# Patient Record
Sex: Female | Born: 1965 | Race: Black or African American | Hispanic: No | Marital: Married | Smoking: Never smoker
Health system: Southern US, Community
[De-identification: ages and names within clinical notes are randomized; demographics above are authoritative.]

## PROBLEM LIST (undated history)

## (undated) DIAGNOSIS — M25562 Pain in left knee: Secondary | ICD-10-CM

## (undated) DIAGNOSIS — K219 Gastro-esophageal reflux disease without esophagitis: Secondary | ICD-10-CM

## (undated) DIAGNOSIS — R0789 Other chest pain: Principal | ICD-10-CM

## (undated) DIAGNOSIS — G8929 Other chronic pain: Secondary | ICD-10-CM

## (undated) DIAGNOSIS — E669 Obesity, unspecified: Secondary | ICD-10-CM

## (undated) DIAGNOSIS — K08409 Partial loss of teeth, unspecified cause, unspecified class: Secondary | ICD-10-CM

## (undated) DIAGNOSIS — R011 Cardiac murmur, unspecified: Secondary | ICD-10-CM

## (undated) DIAGNOSIS — M25561 Pain in right knee: Secondary | ICD-10-CM

## (undated) DIAGNOSIS — M7551 Bursitis of right shoulder: Secondary | ICD-10-CM

## (undated) DIAGNOSIS — T7840XA Allergy, unspecified, initial encounter: Secondary | ICD-10-CM

## (undated) DIAGNOSIS — Z01419 Encounter for gynecological examination (general) (routine) without abnormal findings: Secondary | ICD-10-CM

## (undated) HISTORY — DX: Encounter for gynecological examination (general) (routine) without abnormal findings: Z01.419

## (undated) HISTORY — DX: Pain in left knee: M25.562

## (undated) HISTORY — PX: TUBAL LIGATION: SHX77

## (undated) HISTORY — DX: Obesity, unspecified: E66.9

## (undated) HISTORY — DX: Pain in right knee: M25.561

## (undated) HISTORY — DX: Partial loss of teeth, unspecified cause, unspecified class: K08.409

## (undated) HISTORY — DX: Other chronic pain: G89.29

## (undated) HISTORY — DX: Allergy, unspecified, initial encounter: T78.40XA

## (undated) HISTORY — DX: Other chest pain: R07.89

## (undated) HISTORY — DX: Bursitis of right shoulder: M75.51

---

## 1987-04-10 HISTORY — PX: KNEE SURGERY: SHX244

## 1998-05-26 ENCOUNTER — Encounter: Payer: Self-pay | Admitting: *Deleted

## 1998-05-26 ENCOUNTER — Inpatient Hospital Stay (HOSPITAL_COMMUNITY): Admission: AD | Admit: 1998-05-26 | Discharge: 1998-05-26 | Payer: Self-pay | Admitting: *Deleted

## 2004-05-03 ENCOUNTER — Ambulatory Visit: Payer: Self-pay | Admitting: Internal Medicine

## 2004-05-08 ENCOUNTER — Other Ambulatory Visit: Admission: RE | Admit: 2004-05-08 | Discharge: 2004-05-08 | Payer: Self-pay | Admitting: Obstetrics & Gynecology

## 2005-04-26 ENCOUNTER — Emergency Department (HOSPITAL_COMMUNITY): Admission: EM | Admit: 2005-04-26 | Discharge: 2005-04-26 | Payer: Self-pay | Admitting: Emergency Medicine

## 2005-08-12 ENCOUNTER — Inpatient Hospital Stay (HOSPITAL_COMMUNITY): Admission: AD | Admit: 2005-08-12 | Discharge: 2005-08-12 | Payer: Self-pay | Admitting: Obstetrics and Gynecology

## 2008-10-16 ENCOUNTER — Emergency Department (HOSPITAL_COMMUNITY): Admission: EM | Admit: 2008-10-16 | Discharge: 2008-10-16 | Payer: Self-pay | Admitting: Emergency Medicine

## 2008-10-16 ENCOUNTER — Emergency Department (HOSPITAL_COMMUNITY): Admission: EM | Admit: 2008-10-16 | Discharge: 2008-10-16 | Payer: Self-pay | Admitting: Family Medicine

## 2010-07-16 LAB — URINE CULTURE

## 2010-07-16 LAB — DIFFERENTIAL
Eosinophils Absolute: 0 10*3/uL (ref 0.0–0.7)
Eosinophils Relative: 0 % (ref 0–5)
Lymphs Abs: 0.6 10*3/uL — ABNORMAL LOW (ref 0.7–4.0)
Monocytes Absolute: 0.3 10*3/uL (ref 0.1–1.0)
Monocytes Relative: 3 % (ref 3–12)

## 2010-07-16 LAB — POCT URINALYSIS DIP (DEVICE)
Bilirubin Urine: NEGATIVE
Glucose, UA: NEGATIVE mg/dL
Ketones, ur: NEGATIVE mg/dL
Nitrite: NEGATIVE
Protein, ur: 30 mg/dL — AB
Specific Gravity, Urine: >=1.03 (ref 1.005–1.030)
Urobilinogen, UA: 0.2 mg/dL (ref 0.0–1.0)
pH: 5.5 (ref 5.0–8.0)

## 2010-07-16 LAB — CBC
HCT: 40.7 % (ref 36.0–46.0)
Hemoglobin: 13.5 g/dL (ref 12.0–15.0)
MCV: 89.9 fL (ref 78.0–100.0)
RBC: 4.52 MIL/uL (ref 3.87–5.11)
WBC: 10 10*3/uL (ref 4.0–10.5)

## 2010-12-29 ENCOUNTER — Inpatient Hospital Stay (INDEPENDENT_AMBULATORY_CARE_PROVIDER_SITE_OTHER)
Admission: RE | Admit: 2010-12-29 | Discharge: 2010-12-29 | Disposition: A | Discharge status: Home / Self Care | Payer: Self-pay | Source: Ambulatory Visit | Attending: Family Medicine | Admitting: Family Medicine

## 2010-12-29 DIAGNOSIS — M67919 Unspecified disorder of synovium and tendon, unspecified shoulder: Secondary | ICD-10-CM

## 2011-05-17 ENCOUNTER — Ambulatory Visit
Admission: RE | Admit: 2011-05-17 | Discharge: 2011-05-17 | Disposition: A | Discharge status: Home / Self Care | Payer: Managed Care, Other (non HMO) | Source: Ambulatory Visit | Attending: Orthopedic Surgery | Admitting: Orthopedic Surgery

## 2011-05-17 ENCOUNTER — Other Ambulatory Visit: Payer: Self-pay | Admitting: Orthopedic Surgery

## 2011-05-17 DIAGNOSIS — M542 Cervicalgia: Secondary | ICD-10-CM

## 2012-10-06 ENCOUNTER — Other Ambulatory Visit: Payer: Self-pay | Admitting: Obstetrics and Gynecology

## 2012-10-06 DIAGNOSIS — R928 Other abnormal and inconclusive findings on diagnostic imaging of breast: Secondary | ICD-10-CM

## 2012-10-16 ENCOUNTER — Ambulatory Visit
Admission: RE | Admit: 2012-10-16 | Discharge: 2012-10-16 | Disposition: A | Discharge status: Home / Self Care | Payer: Managed Care, Other (non HMO) | Source: Ambulatory Visit | Attending: Obstetrics and Gynecology | Admitting: Obstetrics and Gynecology

## 2012-10-16 DIAGNOSIS — R928 Other abnormal and inconclusive findings on diagnostic imaging of breast: Secondary | ICD-10-CM

## 2013-01-26 ENCOUNTER — Encounter: Payer: Self-pay | Admitting: Medical

## 2013-02-23 ENCOUNTER — Encounter: Payer: Self-pay | Admitting: Medical

## 2013-04-26 ENCOUNTER — Emergency Department (INDEPENDENT_AMBULATORY_CARE_PROVIDER_SITE_OTHER): Payer: Managed Care, Other (non HMO)

## 2013-04-26 ENCOUNTER — Emergency Department (HOSPITAL_COMMUNITY)
Admission: EM | Admit: 2013-04-26 | Discharge: 2013-04-26 | Disposition: A | Discharge status: Home / Self Care | Payer: Managed Care, Other (non HMO) | Source: Home / Self Care | Attending: Emergency Medicine | Admitting: Emergency Medicine

## 2013-04-26 ENCOUNTER — Encounter (HOSPITAL_COMMUNITY): Payer: Self-pay | Admitting: Emergency Medicine

## 2013-04-26 DIAGNOSIS — B349 Viral infection, unspecified: Secondary | ICD-10-CM

## 2013-04-26 DIAGNOSIS — B9789 Other viral agents as the cause of diseases classified elsewhere: Secondary | ICD-10-CM

## 2013-04-26 LAB — CBC WITH DIFFERENTIAL/PLATELET
BASOS ABS: 0 10*3/uL (ref 0.0–0.1)
BASOS PCT: 0 % (ref 0–1)
EOS ABS: 0.2 10*3/uL (ref 0.0–0.7)
Eosinophils Relative: 3 % (ref 0–5)
HCT: 40.2 % (ref 36.0–46.0)
HEMOGLOBIN: 13.4 g/dL (ref 12.0–15.0)
Lymphocytes Relative: 39 % (ref 12–46)
Lymphs Abs: 2.3 10*3/uL (ref 0.7–4.0)
MCH: 29.9 pg (ref 26.0–34.0)
MCHC: 33.3 g/dL (ref 30.0–36.0)
MCV: 89.7 fL (ref 78.0–100.0)
MONOS PCT: 6 % (ref 3–12)
Monocytes Absolute: 0.4 10*3/uL (ref 0.1–1.0)
NEUTROS PCT: 52 % (ref 43–77)
Neutro Abs: 3 10*3/uL (ref 1.7–7.7)
PLATELETS: 310 10*3/uL (ref 150–400)
RBC: 4.48 MIL/uL (ref 3.87–5.11)
RDW: 13.4 % (ref 11.5–15.5)
WBC: 5.8 10*3/uL (ref 4.0–10.5)

## 2013-04-26 LAB — POCT RAPID STREP A: STREPTOCOCCUS, GROUP A SCREEN (DIRECT): NEGATIVE

## 2013-04-26 LAB — POCT INFECTIOUS MONO SCREEN: Mono Screen: NEGATIVE

## 2013-04-26 MED ORDER — IBUPROFEN 800 MG PO TABS
ORAL_TABLET | ORAL | Status: AC
  Filled 2013-04-26: qty 1

## 2013-04-26 MED ORDER — PREDNISONE 20 MG PO TABS: ORAL_TABLET | ORAL | Status: DC

## 2013-04-26 MED ORDER — HYDROCODONE-ACETAMINOPHEN 5-325 MG PO TABS: ORAL_TABLET | ORAL | Status: DC

## 2013-04-26 MED ORDER — IBUPROFEN 800 MG PO TABS
800.0000 mg | ORAL_TABLET | Freq: Once | ORAL | Status: AC
  Administered 2013-04-26: 800 mg via ORAL

## 2013-04-26 NOTE — Discharge Instructions (Signed)
Pharyngitis °Pharyngitis is redness, pain, and swelling (inflammation) of your pharynx.  °CAUSES  °Pharyngitis is usually caused by infection. Most of the time, these infections are from viruses (viral) and are part of a cold. However, sometimes pharyngitis is caused by bacteria (bacterial). Pharyngitis can also be caused by allergies. Viral pharyngitis may be spread from person to person by coughing, sneezing, and personal items or utensils (cups, forks, spoons, toothbrushes). Bacterial pharyngitis may be spread from person to person by more intimate contact, such as kissing.  °SIGNS AND SYMPTOMS  °Symptoms of pharyngitis include:   °· Sore throat.   °· Tiredness (fatigue).   °· Low-grade fever.   °· Headache. °· Joint pain and muscle aches. °· Skin rashes. °· Swollen lymph nodes. °· Plaque-like film on throat or tonsils (often seen with bacterial pharyngitis). °DIAGNOSIS  °Your health care provider will ask you questions about your illness and your symptoms. Your medical history, along with a physical exam, is often all that is needed to diagnose pharyngitis. Sometimes, a rapid strep test is done. Other lab tests may also be done, depending on the suspected cause.  °TREATMENT  °Viral pharyngitis will usually get better in 3 4 days without the use of medicine. Bacterial pharyngitis is treated with medicines that kill germs (antibiotics).  °HOME CARE INSTRUCTIONS  °· Drink enough water and fluids to keep your urine clear or pale yellow.   °· Only take over-the-counter or prescription medicines as directed by your health care provider:   °· If you are prescribed antibiotics, make sure you finish them even if you start to feel better.   °· Do not take aspirin.   °· Get lots of rest.   °· Gargle with 8 oz of salt water (½ tsp of salt per 1 qt of water) as often as every 1 2 hours to soothe your throat.   °· Throat lozenges (if you are not at risk for choking) or sprays may be used to soothe your throat. °SEEK MEDICAL  CARE IF:  °· You have large, tender lumps in your neck. °· You have a rash. °· You cough up green, yellow-brown, or bloody spit. °SEEK IMMEDIATE MEDICAL CARE IF:  °· Your neck becomes stiff. °· You drool or are unable to swallow liquids. °· You vomit or are unable to keep medicines or liquids down. °· You have severe pain that does not go away with the use of recommended medicines. °· You have trouble breathing (not caused by a stuffy nose). °MAKE SURE YOU:  °· Understand these instructions. °· Will watch your condition. °· Will get help right away if you are not doing well or get worse. °Document Released: 03/26/2005 Document Revised: 01/14/2013 Document Reviewed: 12/01/2012 °ExitCare® Patient Information ©2014 [employer]. ° °

## 2013-04-26 NOTE — ED Provider Notes (Signed)
Chief Complaint:   Chief Complaint  Patient presents with  . Torticollis    History of Present Illness:   [name] is a 48 year old female who's had a 9 day history of right neck pain. She woke up with this pain. She denies any injury to the neck. The pain is localized to the right side of the neck and radiates up into the head and down towards the shoulder but not in the arm. There is no numbness, tingling, weakness in the arm. No swelling of the arm. It hurts to move the neck in any direction. The patient also has had sore throat, fever of up to 101, cough, and headache. She denies any nasal congestion, rhinorrhea, swollen glands in her neck, chest pain, or shortness of breath. She went to [facility] when this pain first began. That was a week ago. She had strep test there which was negative. She was told to take Aleve. She returned this past Friday which was 3 days ago to the same [facility]. They detected an ear infection and gave her amoxicillin. She still feels no better.  Review of Systems:  Other than noted above, the patient denies any of the following symptoms: Constitutional:  No fever, chills, or sweats. ENT:  No nasal congestion, sore throat, or oral ulcerations or lesions. Neck:  No swelling, or adenopathy.  Full ROM without pain. Cardiac:  No chest pain, tightness, or pressure. Respiratory:  No cough, wheezing, or dyspnea. M-S:  No joint pain, muscle pain, or back problems. Neuro:  No muscle weakness, numbness or paresthesias.  PMFSH:  Past medical history, family history, social history, meds, and allergies were reviewed.   Physical Exam:   Vital signs:  BP 114/69  Pulse 68  Temp(Src) 98.2 F (36.8 C) (Oral)  Resp 18  SpO2 99% General:  Alert, oriented and in no distress. Eye:  PERRL, full EOMs. ENT:  Pharynx was erythematous and swollen with no exudate, no oral lesions. Both TMs and canals were normal. Neck:  There is pain to palpation on  the right side of the neck, no adenopathy or mass. The neck has limited range of motion with pain in all directions. Lungs:  No respiratory distress.  Breath sounds clear and equal bilaterally.  No wheezes, rales or rhonchi. Heart:  Regular rhythm.  No gallops, murmers, or rubs. Ext:  No upper extremity edema, pulses full.  Full ROM of joints with no joint or muscle pain to palpation. Neuro:  Alert and oriented times 3.  No focal muscle weakness.  DTRs symmetric.  Sensation intact to light touch. Skin: Clear, warm and dry.  No rash.  Good capillary refill.  Radiology:  Dg Cervical Spine Complete  04/26/2013   CLINICAL DATA:  Right-sided neck pain for 9 days.  No known injury.  EXAM: CERVICAL SPINE  4+ VIEWS  COMPARISON:  MR C SPINE W/O CM dated 05/17/2011; DG CERVICAL SPINE COMPLETE dated 04/26/2005  FINDINGS: The prevertebral soft tissues are normal. The alignment is anatomic through T1. There is no evidence of acute fracture or traumatic subluxation. The C1-2 articulation appears normal in the AP projection. The disc spaces are preserved. There is no osseous foraminal stenosis.  IMPRESSION: Stable unremarkable cervical spine radiographs.   Electronically Signed   By: Roxy HorsemanBill  Veazey M.D.   On: 04/26/2013 14:12   Other Labs Obtained at Urgent Care Center:    Results for orders placed during the hospital encounter of 04/26/13  CBC  WITH DIFFERENTIAL      Result Value Range   WBC 5.8  4.0 - 10.5 K/uL   RBC 4.48  3.87 - 5.11 MIL/uL   Hemoglobin 13.4  12.0 - 15.0 g/dL   HCT 16.1  09.6 - 04.5 %   MCV 89.7  78.0 - 100.0 fL   MCH 29.9  26.0 - 34.0 pg   MCHC 33.3  30.0 - 36.0 g/dL   RDW 40.9  81.1 - 91.4 %   Platelets 310  150 - 400 K/uL   Neutrophils Relative % 52  43 - 77 %   Neutro Abs 3.0  1.7 - 7.7 K/uL   Lymphocytes Relative 39  12 - 46 %   Lymphs Abs 2.3  0.7 - 4.0 K/uL   Monocytes Relative 6  3 - 12 %   Monocytes Absolute 0.4  0.1 - 1.0 K/uL   Eosinophils Relative 3  0 - 5 %   Eosinophils  Absolute 0.2  0.0 - 0.7 K/uL   Basophils Relative 0  0 - 1 %   Basophils Absolute 0.0  0.0 - 0.1 K/uL  POCT RAPID STREP A ([facility] CARE ONLY)      Result Value Range   Streptococcus, Group A Screen (Direct) NEGATIVE  NEGATIVE  POCT INFECTIOUS MONO SCREEN      Result Value Range   Mono Screen NEGATIVE  NEGATIVE   Course in Urgent Care Center:   Given Motrin 800 mg by mouth.  Assessment:  The encounter diagnosis was Viral syndrome.  No evidence of meningitis, strep, or mono. Her x-ray was negative. This is most consistent with a viral illness.  Plan:   1.  Meds:  The following meds were prescribed:   Discharge Medication List as of 04/26/2013  3:11 PM    START taking these medications   Details  HYDROcodone-acetaminophen (NORCO/VICODIN) 5-325 MG per tablet 1 to 2 tabs every 4 to 6 hours as needed for pain., Print    predniSONE (DELTASONE) 20 MG tablet 3 daily for 5 days, 2 daily for 5 days, 1 daily for 5 days, Normal        2.  Patient Education/Counseling:  The patient was given appropriate handouts, self care instructions, and instructed in symptomatic relief.   3.  Follow up:  The patient was told to follow up if no better in 3 to 4 days, if becoming worse in any way, and given some red flag symptoms such as worsening fever or worsening pain which would prompt immediate return.  Follow up here as necessary.     Reuben Likes, MD 04/26/13 2121

## 2013-04-26 NOTE — ED Notes (Signed)
Pt given graham cracker along with iburpofen since she has not eaten anything today

## 2013-04-26 NOTE — ED Notes (Signed)
Pt c/o right sided neck pain and stiffness x 1 week. Throat is also sore. [facility] and was told she had a right ear infection and started on amoxicillin bid. Pt reports she feels a little better after starting but has no ROM in her neck still. Pt is alert and oriented and in no acute distress.

## 2013-04-26 NOTE — ED Notes (Signed)
Rx was not attached for hydrocodene/acetaminophen Rx called in [facility].

## 2013-04-28 LAB — CULTURE, GROUP A STREP

## 2013-08-06 ENCOUNTER — Ambulatory Visit (INDEPENDENT_AMBULATORY_CARE_PROVIDER_SITE_OTHER): Payer: Managed Care, Other (non HMO) | Admitting: Medical

## 2013-08-06 ENCOUNTER — Encounter: Payer: Self-pay | Admitting: Medical

## 2013-08-06 VITALS — BP 110/78 | HR 76 | Temp 98.0°F | Resp 16 | Ht 62.0 in | Wt 136.0 lb

## 2013-08-06 DIAGNOSIS — L989 Disorder of the skin and subcutaneous tissue, unspecified: Secondary | ICD-10-CM

## 2013-08-06 DIAGNOSIS — Z0000 Encounter for general adult medical examination without abnormal findings: Secondary | ICD-10-CM

## 2013-08-06 DIAGNOSIS — R490 Dysphonia: Secondary | ICD-10-CM

## 2013-08-06 DIAGNOSIS — Z0100 Encounter for examination of eyes and vision without abnormal findings: Secondary | ICD-10-CM

## 2013-08-06 LAB — COMPREHENSIVE METABOLIC PANEL
ALK PHOS: 52 U/L (ref 39–117)
ALT: 16 U/L (ref 0–35)
AST: 15 U/L (ref 0–37)
Albumin: 4.2 g/dL (ref 3.5–5.2)
BUN: 17 mg/dL (ref 6–23)
CO2: 28 mEq/L (ref 19–32)
CREATININE: 0.79 mg/dL (ref 0.50–1.10)
Calcium: 9.1 mg/dL (ref 8.4–10.5)
Chloride: 107 mEq/L (ref 96–112)
Glucose, Bld: 83 mg/dL (ref 70–99)
POTASSIUM: 3.8 meq/L (ref 3.5–5.3)
Sodium: 141 mEq/L (ref 135–145)
Total Bilirubin: 0.3 mg/dL (ref 0.2–1.2)
Total Protein: 7.3 g/dL (ref 6.0–8.3)

## 2013-08-06 LAB — POCT URINALYSIS DIPSTICK
BILIRUBIN UA: NEGATIVE
Glucose, UA: NEGATIVE
KETONES UA: NEGATIVE
LEUKOCYTES UA: NEGATIVE
Nitrite, UA: NEGATIVE
Spec Grav, UA: 1.025
Urobilinogen, UA: NEGATIVE
pH, UA: 5

## 2013-08-06 LAB — LIPID PANEL
CHOL/HDL RATIO: 2.4 ratio
CHOLESTEROL: 195 mg/dL (ref 0–200)
HDL: 82 mg/dL (ref >39)
LDL CALC: 98 mg/dL (ref 0–99)
TRIGLYCERIDES: 74 mg/dL (ref <150)
VLDL: 15 mg/dL (ref 0–40)

## 2013-08-06 LAB — TSH: TSH: 1.613 u[IU]/mL (ref 0.350–4.500)

## 2013-08-06 MED ORDER — ESOMEPRAZOLE MAGNESIUM 40 MG PO CPDR
40.0000 mg | DELAYED_RELEASE_CAPSULE | Freq: Every day | ORAL | Status: DC
Start: 2013-08-06 — End: 2013-10-06

## 2013-08-06 NOTE — Progress Notes (Signed)
Subjective:   HPI  [name] is a 48 y.o. female who presents for a complete physical.  Medical care team includes:  Dr. Henderson CloudHorvath for gynecology  Dr. August Saucerean, orthopedics  Kristian CoveyShane Tysinger, PA-C establishing here today for care   Preventative care: Last ophthalmology visit:3 yrs ago no doctor Last dental visit:2 yrs no dentist Last colonoscopy:no Last mammogram:2014 Last gynecological exam2014 [place]: Last EKG:yrs ago Last labs:2014  Prior vaccinations: TD or Tdap:2013 Influenza:2014 Pneumococcal:no Shingles/Zostavax:no  Advanced directive:no Health care power of attorney:no Living will:no  Concerns: Hoarseness in voice, intermittent, x  1year.  Has attributed to mucous, allergies.    Screams at games, son plays basketball.  Not sure about acid reflux, gets epigastric pain occasionally.   Skin lesions changing under breasts that aggravate her.  Wants them removed.  They have been getting bigger.  Reviewed their medical, surgical, family, social, medication, and allergy history and updated chart as appropriate.  Past Medical History  Diagnosis Date  . Allergy   . Bursitis of shoulder, right   . Knee pain, bilateral     intermittent pain, ran track in high school  . Wisdom teeth extracted   . Routine gynecological examination     Dr. Henderson CloudHorvath  . History of mammogram 2014    diagnostic, benign findings    Past Surgical History  Procedure Laterality Date  . Knee surgery  1989    tendonitis, right knee;   . Tubal ligation      History   Social History  . Marital Status: Married    Spouse Name: N/A    Number of Children: N/A  . Years of Education: N/A   Occupational History  . Not on file.   Social History Main Topics  . Smoking status: Never Smoker   . Smokeless tobacco: Not on file  . Alcohol Use: No  . Drug Use: No  . Sexual Activity: Not on file   Other Topics Concern  . Not on file   Social History Narrative   Lives at home  with husband, father, 2 children, 47yo and 17yo, 1 grandchild, [name], Manufacturing systems engineerpreschool teacher at [employer], exercise with walking and stretching    Family History  Problem Relation Age of Onset  . Hypertension Mother   . Kidney disease Mother     hypertensive kidney failure, renal transplant  . Diabetes Mother   . Hypertension Father   . Fibroids Sister   . Cancer Neg Hx   . Heart disease Neg Hx   . Stroke Neg Hx     Current outpatient prescriptions:Multiple Vitamins-Minerals (MULTIVITAMIN WITH MINERALS) tablet, Take 1 tablet by mouth daily., Disp: , Rfl: ;  HYDROcodone-acetaminophen (NORCO/VICODIN) 5-325 MG per tablet, 1 to 2 tabs every 4 to 6 hours as needed for pain., Disp: 20 tablet, Rfl: 0;  predniSONE (DELTASONE) 20 MG tablet, 3 daily for 5 days, 2 daily for 5 days, 1 daily for 5 days, Disp: 30 tablet, Rfl: 0  No Known Allergies     Review of Systems Constitutional: -fever, -chills, -sweats, -unexpected weight change, -decreased appetite, +fatigue Allergy: -sneezing, -itching, -congestion Dermatology: +changing moles, --rash, -lumps ENT: -runny nose, -ear pain, -sore throat, -+hoarseness, -sinus pain, -teeth pain, - ringing in ears, -hearing loss, -nosebleeds Cardiology: -chest pain, -palpitations, -swelling, -difficulty breathing when lying flat, -waking up short of breath Respiratory: -cough, -shortness of breath, -difficulty breathing with exercise or exertion, -wheezing, -coughing up blood Gastroenterology: -abdominal pain, -nausea, -vomiting, -diarrhea, -constipation, -blood in  stool, -changes in bowel movement, -difficulty swallowing or eating Hematology: -bleeding, -bruising  Musculoskeletal: +joint aches, +muscle aches, +joint swelling, -back pain, -neck pain, -cramping, -changes in gait Ophthalmology: denies vision changes, eye redness, itching, discharge Urology: -burning with urination, -difficulty urinating, -blood in urine, -urinary frequency, -urgency,  -incontinence Neurology: -headache, -weakness, -tingling, -numbness, -memory loss, -falls, -dizziness Psychology: -depressed mood, -agitation, -sleep problems     Objective:   Physical Exam  BP 110/78  Pulse 76  Temp(Src) 98 F (36.7 C) (Oral)  Resp 16  Ht 5\' 2"  (1.575 m)  Wt 136 lb (61.689 kg)  BMI 24.87 kg/m2  General appearance: alert, no distress, WD/WN, AA female Skin: several small brown lesions under both breasts, 1 under left breast with raised brown fleshy lesion, right upper back 4cm x 1cm flat scar, likely from prior scar/abrasion, no worrisome lesions HEENT: normocephalic, conjunctiva/corneas normal, sclerae anicteric, PERRLA, EOMi, nares patent, no discharge or erythema, pharynx normal Oral cavity: MMM, tongue normal, teeth - few areas of decay, otherwise in good repair Neck: supple, no lymphadenopathy, no thyromegaly, no masses, normal ROM, no bruits Chest: non tender, normal shape and expansion Heart: RRR, normal S1, S2, no murmurs Lungs: CTA bilaterally, no wheezes, rhonchi, or rales Abdomen: +bs, soft, non tender, non distended, no masses, no hepatomegaly, no splenomegaly, no bruits Back: non tender, normal ROM, no scoliosis Musculoskeletal: upper extremities non tender, no obvious deformity, normal ROM throughout, lower extremities non tender, no obvious deformity, normal ROM throughout Extremities: no edema, no cyanosis, no clubbing Pulses: 2+ symmetric, upper and lower extremities, normal cap refill Neurological: alert, oriented x 3, CN2-12 intact, strength normal upper extremities and lower extremities, sensation normal throughout, DTRs 2+ throughout, no cerebellar signs, gait normal Psychiatric: normal affect, behavior normal, pleasant  Breast/gyn/rectal - deferred to gyn   Assessment and Plan :    Encounter Diagnoses  Name Primary?  . Routine general medical examination at a health care facility Yes  . Hoarseness of voice   . Skin lesion of chest wall       Physical exam - discussed healthy lifestyle, diet, exercise, preventative care, vaccinations, and addressed their concerns.  Handout given. See eye doctor and dentist, see gynecology yearly Hoarseness - begin Nexium samples, avoid GERD triggers, recheck 1-2681mo Skin lesion - return for procedure, biopsy Follow-up pending labs, 1-2 mo on hoarseness.

## 2013-08-06 NOTE — Addendum Note (Signed)
Addended by: Lilli LightLOMAX, Charolett Yarrow G on: 08/06/2013 01:11 PM   Modules accepted: Orders

## 2013-08-06 NOTE — Patient Instructions (Addendum)
  Thank you for giving me the opportunity to serve you today.    Your diagnosis today includes: Encounter Diagnoses  Name Primary?  . Routine general medical examination at a health care facility Yes  . Hoarseness of voice      Specific recommendations today include:  See eye doctor and dentist yearly  Begin acid reflux medication once daily 30-45 minutes prior to breakfast  Avoid acid reflux trigger foods  If you feel that allergies or post nasal drainage are also affecting your hoaroanes, then begin Zyrtec at bedtime daily  Recheck in 1-2 months on hoarsness  Get flu shot yearly, preferably in September    Return pending labs.    I have included other useful information below for your review.  Diet for Gastroesophageal Reflux Disease, Adult Reflux (acid reflux) is when acid from your stomach flows up into the esophagus. When acid comes in contact with the esophagus, the acid causes irritation and soreness (inflammation) in the esophagus. When reflux happens often or so severely that it causes damage to the esophagus, it is called gastroesophageal reflux disease (GERD). Nutrition therapy can help ease the discomfort of GERD. FOODS OR DRINKS TO AVOID OR LIMIT  Smoking or chewing tobacco. Nicotine is one of the most potent stimulants to acid production in the gastrointestinal tract.  Caffeinated and decaffeinated coffee and black tea.  Regular or low-calorie carbonated beverages or energy drinks (caffeine-free carbonated beverages are allowed).   Strong spices, such as black pepper, white pepper, red pepper, cayenne, curry powder, and chili powder.  Peppermint or spearmint.  Chocolate.  High-fat foods, including meats and fried foods. Extra added fats including oils, butter, salad dressings, and nuts. Limit these to less than 8 tsp per day.  Fruits and vegetables if they are not tolerated, such as citrus fruits or tomatoes.  Alcohol.  Any food that seems to  aggravate your condition. If you have questions regarding your diet, call your caregiver or a registered dietitian. OTHER THINGS THAT MAY HELP GERD INCLUDE:   Eating your meals slowly, in a relaxed setting.  Eating 5 to 6 small meals per day instead of 3 large meals.  Eliminating food for a period of time if it causes distress.  Not lying down until 3 hours after eating a meal.  Keeping the head of your bed raised 6 to 9 inches (15 to 23 cm) by using a foam wedge or blocks under the legs of the bed. Lying flat may make symptoms worse.  Being physically active. Weight loss may be helpful in reducing reflux in overweight or obese adults.  Wear loose fitting clothing EXAMPLE MEAL PLAN This meal plan is approximately 2,000 calories based on https://www.[place].org/ChooseMyPlate.gov meal planning guidelines. Breakfast   cup cooked oatmeal.  1 cup strawberries.  1 cup low-fat milk.  1 oz almonds. Snack  1 cup cucumber slices.  6 oz yogurt (made from low-fat or fat-free milk). Lunch  2 slice whole-wheat bread.  2 oz sliced [place]urkey.  2 tsp mayonnaise.  1 cup blueberries.  1 cup snap peas. Snack  6 whole-wheat crackers.  1 oz string cheese. Dinner   cup brown rice.  1 cup mixed veggies.  1 tsp olive oil.  3 oz grilled fish. Document Released: 03/26/2005 Document Revised: 06/18/2011 Document Reviewed: 02/09/2011 [facility] Patient Information 2014 [employer], [place].

## 2013-08-07 ENCOUNTER — Encounter: Payer: Self-pay | Admitting: Family Medicine

## 2013-08-07 LAB — VITAMIN D 25 HYDROXY (VIT D DEFICIENCY, FRACTURES): Vit D, 25-Hydroxy: 35 ng/mL (ref 30–89)

## 2013-09-15 ENCOUNTER — Other Ambulatory Visit: Payer: Self-pay | Admitting: Orthopedic Surgery

## 2013-09-15 DIAGNOSIS — R52 Pain, unspecified: Secondary | ICD-10-CM

## 2013-09-19 ENCOUNTER — Ambulatory Visit
Admission: RE | Admit: 2013-09-19 | Discharge: 2013-09-19 | Disposition: A | Discharge status: Home / Self Care | Payer: Managed Care, Other (non HMO) | Source: Ambulatory Visit | Attending: Orthopedic Surgery | Admitting: Orthopedic Surgery

## 2013-09-19 DIAGNOSIS — R52 Pain, unspecified: Secondary | ICD-10-CM

## 2013-09-29 ENCOUNTER — Encounter (HOSPITAL_COMMUNITY): Payer: Self-pay | Admitting: Pharmacy Technician

## 2013-09-29 ENCOUNTER — Other Ambulatory Visit (HOSPITAL_COMMUNITY): Payer: Self-pay | Admitting: Orthopedic Surgery

## 2013-09-30 NOTE — Pre-Procedure Instructions (Signed)
Sherrian T Turner-Cuthrell  09/30/2013   Your procedure is scheduled on: Tuesday, October 06, 2013  [facility]45 [facility].  Call this number if you have problems the morning of surgery: [phone]   Remember:   Do not eat food or drink liquids after midnight Monday, October 05, 2013     Take these medicines the morning of surgery with A SIP OF WATER: esomeprazole (NEXIUM) Stop taking Aspirin, vitamins and herbal medications. Do not take any NSAIDs ie: Ibuprofen, Advil, Naproxen or any medication containing Aspirin; stop now.   Do not wear jewelry, make-up or nail polish.  Do not wear lotions, powders, or perfumes. You may wear deodorant.  Do not shave 48 hours prior to surgery.   Do not bring valuables to the hospital.  [facility] is not responsible for any belongings or valuables.               Contacts, dentures or bridgework may not be worn into surgery.  Leave suitcase in the car. After surgery it may be brought to your room.  For patients admitted to the hospital, discharge time is determined by your treatment team.               Patients discharged the day of surgery will not be allowed to drive home.  Name and phone number of your driver:   Special Instructions:  Special Instructions:Special Instructions: [facility]  Before surgery, you can play an important role.  Because skin is not sterile, your skin needs to be as free of germs as possible.  You can reduce the number of germs on you skin by washing with CHG (chlorahexidine gluconate) soap before surgery.  CHG is an antiseptic cleaner which kills germs and bonds with the skin to continue killing germs even after washing.  Please DO NOT use if you have an allergy to CHG or antibacterial soaps.  If your skin becomes reddened/irritated stop using the CHG and inform your nurse when you arrive at Short Stay.  Do not shave (including legs and underarms) for at least 48 hours  prior to the first CHG shower.  You may shave your face.  Please follow these instructions carefully:   1.  Shower with CHG Soap the night before surgery and the morning of Surgery.  2.  If you choose to wash your hair, wash your hair first as usual with your normal shampoo.  3.  After you shampoo, rinse your hair and body thoroughly to remove the Shampoo.  4.  Use CHG as you would any other liquid soap.  You can apply chg directly  to the skin and wash gently with scrungie or a clean washcloth.  5.  Apply the CHG Soap to your body ONLY FROM THE NECK DOWN.  Do not use on open wounds or open sores.  Avoid contact with your eyes, ears, mouth and genitals (private parts).  Wash genitals (private parts) with your normal soap.  6.  Wash thoroughly, paying special attention to the area where your surgery will be performed.  7.  Thoroughly rinse your body with warm water from the neck down.  8.  DO NOT shower/wash with your normal soap after using and rinsing off the CHG Soap.  9.  Pat yourself dry with a clean towel.            10.  Wear clean pajamas.  11.  Place clean sheets on your bed the night of your first shower and do not sleep with pets.  Day of Surgery  Do not apply any lotions the morning of surgery.  Please wear clean clothes to the hospital/surgery center.   Please read over the following fact sheets that you were given: Pain Booklet, Coughing and Deep Breathing and Surgical Site Infection Prevention

## 2013-10-01 ENCOUNTER — Encounter (HOSPITAL_COMMUNITY)
Admission: RE | Admit: 2013-10-01 | Discharge: 2013-10-01 | Disposition: A | Discharge status: Home / Self Care | Payer: Managed Care, Other (non HMO) | Source: Ambulatory Visit | Attending: Orthopedic Surgery | Admitting: Orthopedic Surgery

## 2013-10-01 ENCOUNTER — Encounter (HOSPITAL_COMMUNITY): Payer: Self-pay

## 2013-10-01 DIAGNOSIS — Z01818 Encounter for other preprocedural examination: Secondary | ICD-10-CM | POA: Insufficient documentation

## 2013-10-01 DIAGNOSIS — Z01812 Encounter for preprocedural laboratory examination: Secondary | ICD-10-CM | POA: Insufficient documentation

## 2013-10-01 HISTORY — DX: Gastro-esophageal reflux disease without esophagitis: K21.9

## 2013-10-01 HISTORY — DX: Cardiac murmur, unspecified: R01.1

## 2013-10-01 LAB — BASIC METABOLIC PANEL
BUN: 15 mg/dL (ref 6–23)
CALCIUM: 9.9 mg/dL (ref 8.4–10.5)
CO2: 28 mEq/L (ref 19–32)
CREATININE: 0.79 mg/dL (ref 0.50–1.10)
Chloride: 104 mEq/L (ref 96–112)
GFR calc Af Amer: >90 mL/min (ref >90)
GFR calc non Af Amer: >90 mL/min (ref >90)
GLUCOSE: 86 mg/dL (ref 70–99)
Potassium: 3.9 mEq/L (ref 3.7–5.3)
Sodium: 142 mEq/L (ref 137–147)

## 2013-10-01 LAB — CBC
HCT: 35.7 % — ABNORMAL LOW (ref 36.0–46.0)
Hemoglobin: 11.5 g/dL — ABNORMAL LOW (ref 12.0–15.0)
MCH: 28.6 pg (ref 26.0–34.0)
MCHC: 32.2 g/dL (ref 30.0–36.0)
MCV: 88.8 fL (ref 78.0–100.0)
PLATELETS: 279 10*3/uL (ref 150–400)
RBC: 4.02 MIL/uL (ref 3.87–5.11)
RDW: 13.3 % (ref 11.5–15.5)
WBC: 5.8 10*3/uL (ref 4.0–10.5)

## 2013-10-01 MED ORDER — CHLORHEXIDINE GLUCONATE 4 % EX LIQD: 60.0000 mL | Freq: Once | CUTANEOUS | Status: DC

## 2013-10-05 MED ORDER — CEFAZOLIN SODIUM-DEXTROSE 2-3 GM-% IV SOLR
2.0000 g | INTRAVENOUS | Status: AC
  Administered 2013-10-06: 2 g via INTRAVENOUS
  Filled 2013-10-05: qty 50

## 2013-10-06 ENCOUNTER — Encounter (HOSPITAL_COMMUNITY): Payer: Managed Care, Other (non HMO) | Admitting: Anesthesiology

## 2013-10-06 ENCOUNTER — Encounter (HOSPITAL_COMMUNITY)
Admission: RE | Disposition: A | Discharge status: Home / Self Care | Payer: Self-pay | Source: Ambulatory Visit | Attending: Orthopedic Surgery

## 2013-10-06 ENCOUNTER — Ambulatory Visit (HOSPITAL_COMMUNITY): Payer: Managed Care, Other (non HMO) | Admitting: Anesthesiology

## 2013-10-06 ENCOUNTER — Ambulatory Visit (HOSPITAL_COMMUNITY)
Admission: RE | Admit: 2013-10-06 | Discharge: 2013-10-07 | Disposition: A | Discharge status: Home / Self Care | Payer: Managed Care, Other (non HMO) | Source: Ambulatory Visit | Attending: Orthopedic Surgery | Admitting: Orthopedic Surgery

## 2013-10-06 ENCOUNTER — Encounter (HOSPITAL_COMMUNITY): Payer: Self-pay | Admitting: *Deleted

## 2013-10-06 DIAGNOSIS — M9420 Chondromalacia, unspecified site: Secondary | ICD-10-CM | POA: Insufficient documentation

## 2013-10-06 DIAGNOSIS — K219 Gastro-esophageal reflux disease without esophagitis: Secondary | ICD-10-CM | POA: Insufficient documentation

## 2013-10-06 DIAGNOSIS — M959 Acquired deformity of musculoskeletal system, unspecified: Secondary | ICD-10-CM | POA: Insufficient documentation

## 2013-10-06 DIAGNOSIS — Z5333 Arthroscopic surgical procedure converted to open procedure: Secondary | ICD-10-CM | POA: Insufficient documentation

## 2013-10-06 DIAGNOSIS — M238X2 Other internal derangements of left knee: Secondary | ICD-10-CM | POA: Diagnosis present

## 2013-10-06 DIAGNOSIS — M1710 Unilateral primary osteoarthritis, unspecified knee: Secondary | ICD-10-CM | POA: Insufficient documentation

## 2013-10-06 DIAGNOSIS — IMO0002 Reserved for concepts with insufficient information to code with codable children: Secondary | ICD-10-CM

## 2013-10-06 HISTORY — PX: KNEE ARTHROSCOPY WITH DRILLING/MICROFRACTURE: SHX6425

## 2013-10-06 SURGERY — ARTHROSCOPY, KNEE, WITH ABRASION ARTHROPLASTY OR MICROFRACTURE
Anesthesia: General | Site: Knee | Laterality: Left

## 2013-10-06 MED ORDER — ONDANSETRON HCL 4 MG/2ML IJ SOLN: 4.0000 mg | Freq: Four times a day (QID) | INTRAMUSCULAR | Status: DC | PRN

## 2013-10-06 MED ORDER — LIDOCAINE HCL (CARDIAC) 20 MG/ML IV SOLN
INTRAVENOUS | Status: AC
  Filled 2013-10-06: qty 10

## 2013-10-06 MED ORDER — CHLORHEXIDINE GLUCONATE 4 % EX LIQD
60.0000 mL | Freq: Once | CUTANEOUS | Status: DC
  Filled 2013-10-06: qty 60

## 2013-10-06 MED ORDER — OXYCODONE HCL 5 MG PO TABS
5.0000 mg | ORAL_TABLET | ORAL | Status: DC | PRN
  Administered 2013-10-06 – 2013-10-07 (×4): 10 mg via ORAL
  Filled 2013-10-06 (×4): qty 2

## 2013-10-06 MED ORDER — POTASSIUM CHLORIDE IN NACL 20-0.9 MEQ/L-% IV SOLN
INTRAVENOUS | Status: DC
  Administered 2013-10-06: 22:00:00 via INTRAVENOUS
  Filled 2013-10-06 (×3): qty 1000

## 2013-10-06 MED ORDER — METOCLOPRAMIDE HCL 10 MG PO TABS: 5.0000 mg | ORAL_TABLET | Freq: Three times a day (TID) | ORAL | Status: DC | PRN

## 2013-10-06 MED ORDER — BUPIVACAINE HCL (PF) 0.25 % IJ SOLN
INTRAMUSCULAR | Status: AC
  Filled 2013-10-06: qty 30

## 2013-10-06 MED ORDER — WARFARIN - PHARMACIST DOSING INPATIENT: Freq: Every day | Status: DC

## 2013-10-06 MED ORDER — HYDROMORPHONE HCL PF 1 MG/ML IJ SOLN
0.2500 mg | INTRAMUSCULAR | Status: DC | PRN
  Administered 2013-10-06 (×2): 0.5 mg via INTRAVENOUS

## 2013-10-06 MED ORDER — METOCLOPRAMIDE HCL 5 MG/ML IJ SOLN: 10.0000 mg | Freq: Once | INTRAMUSCULAR | Status: DC | PRN

## 2013-10-06 MED ORDER — LACTATED RINGERS IV SOLN
INTRAVENOUS | Status: DC | PRN
  Administered 2013-10-06 (×2): via INTRAVENOUS

## 2013-10-06 MED ORDER — OXYCODONE HCL 5 MG PO TABS: 5.0000 mg | ORAL_TABLET | Freq: Once | ORAL | Status: DC | PRN

## 2013-10-06 MED ORDER — MORPHINE SULFATE 4 MG/ML IJ SOLN
INTRAMUSCULAR | Status: AC
  Filled 2013-10-06: qty 1

## 2013-10-06 MED ORDER — DEXAMETHASONE SODIUM PHOSPHATE 4 MG/ML IJ SOLN
INTRAMUSCULAR | Status: DC | PRN
  Administered 2013-10-06: 8 mg via INTRAVENOUS

## 2013-10-06 MED ORDER — EVICEL 2 ML EX KIT
PACK | CUTANEOUS | Status: AC
Start: 2013-10-06 — End: 2013-10-06
  Filled 2013-10-06: qty 1

## 2013-10-06 MED ORDER — EVICEL 2 ML EX KIT
PACK | CUTANEOUS | Status: DC | PRN
  Administered 2013-10-06: 2 mL

## 2013-10-06 MED ORDER — ONDANSETRON HCL 4 MG PO TABS: 4.0000 mg | ORAL_TABLET | Freq: Four times a day (QID) | ORAL | Status: DC | PRN

## 2013-10-06 MED ORDER — FENTANYL CITRATE 0.05 MG/ML IJ SOLN
INTRAMUSCULAR | Status: AC
  Filled 2013-10-06: qty 5

## 2013-10-06 MED ORDER — METOCLOPRAMIDE HCL 5 MG/ML IJ SOLN: 5.0000 mg | Freq: Three times a day (TID) | INTRAMUSCULAR | Status: DC | PRN

## 2013-10-06 MED ORDER — LIDOCAINE HCL (CARDIAC) 20 MG/ML IV SOLN
INTRAVENOUS | Status: DC | PRN
  Administered 2013-10-06: 60 mg via INTRAVENOUS

## 2013-10-06 MED ORDER — DOCUSATE SODIUM 100 MG PO CAPS
100.0000 mg | ORAL_CAPSULE | Freq: Two times a day (BID) | ORAL | Status: DC
Start: 2013-10-06 — End: 2013-10-07
  Administered 2013-10-06 – 2013-10-07 (×2): 100 mg via ORAL
  Filled 2013-10-06 (×3): qty 1

## 2013-10-06 MED ORDER — PROPOFOL 10 MG/ML IV BOLUS
INTRAVENOUS | Status: AC
  Filled 2013-10-06: qty 20

## 2013-10-06 MED ORDER — PANTOPRAZOLE SODIUM 40 MG PO TBEC
40.0000 mg | DELAYED_RELEASE_TABLET | Freq: Every day | ORAL | Status: DC
  Administered 2013-10-06 – 2013-10-07 (×2): 40 mg via ORAL
  Filled 2013-10-06: qty 1

## 2013-10-06 MED ORDER — MIDAZOLAM HCL 5 MG/5ML IJ SOLN
INTRAMUSCULAR | Status: DC | PRN
  Administered 2013-10-06: 2 mg via INTRAVENOUS

## 2013-10-06 MED ORDER — SODIUM CHLORIDE 0.9 % IV SOLN
INTRAVENOUS | Status: DC | PRN
  Administered 2013-10-06: 13:00:00 via INTRAVENOUS

## 2013-10-06 MED ORDER — ARTIFICIAL TEARS OP OINT
TOPICAL_OINTMENT | OPHTHALMIC | Status: DC | PRN
  Administered 2013-10-06: 1 via OPHTHALMIC

## 2013-10-06 MED ORDER — LACTATED RINGERS IV SOLN
INTRAVENOUS | Status: DC
  Administered 2013-10-06: 13:00:00 via INTRAVENOUS

## 2013-10-06 MED ORDER — METHOCARBAMOL 1000 MG/10ML IJ SOLN
500.0000 mg | Freq: Four times a day (QID) | INTRAMUSCULAR | Status: DC | PRN
  Filled 2013-10-06: qty 5

## 2013-10-06 MED ORDER — CEFAZOLIN SODIUM-DEXTROSE 2-3 GM-% IV SOLR
2.0000 g | Freq: Four times a day (QID) | INTRAVENOUS | Status: AC
  Administered 2013-10-06 – 2013-10-07 (×2): 2 g via INTRAVENOUS
  Filled 2013-10-06 (×2): qty 50

## 2013-10-06 MED ORDER — MIDAZOLAM HCL 2 MG/2ML IJ SOLN
INTRAMUSCULAR | Status: AC
  Filled 2013-10-06: qty 2

## 2013-10-06 MED ORDER — SODIUM CHLORIDE 0.9 % IR SOLN
Status: DC | PRN
  Administered 2013-10-06 (×2): 3000 mL

## 2013-10-06 MED ORDER — ONDANSETRON HCL 4 MG/2ML IJ SOLN
INTRAMUSCULAR | Status: DC | PRN
  Administered 2013-10-06: 4 mg via INTRAVENOUS

## 2013-10-06 MED ORDER — FENTANYL CITRATE 0.05 MG/ML IJ SOLN
INTRAMUSCULAR | Status: DC | PRN
  Administered 2013-10-06: 25 ug via INTRAVENOUS
  Administered 2013-10-06: 50 ug via INTRAVENOUS
  Administered 2013-10-06: 100 ug via INTRAVENOUS
  Administered 2013-10-06 (×2): 50 ug via INTRAVENOUS
  Administered 2013-10-06: 100 ug via INTRAVENOUS
  Administered 2013-10-06: 25 ug via INTRAVENOUS
  Administered 2013-10-06: 50 ug via INTRAVENOUS

## 2013-10-06 MED ORDER — CLONIDINE HCL (ANALGESIA) 100 MCG/ML EP SOLN
150.0000 ug | Freq: Once | EPIDURAL | Status: DC
  Filled 2013-10-06: qty 1.5

## 2013-10-06 MED ORDER — HYDROMORPHONE HCL PF 1 MG/ML IJ SOLN
INTRAMUSCULAR | Status: AC
  Filled 2013-10-06: qty 1

## 2013-10-06 MED ORDER — METHOCARBAMOL 500 MG PO TABS
500.0000 mg | ORAL_TABLET | Freq: Four times a day (QID) | ORAL | Status: DC | PRN
  Administered 2013-10-06 – 2013-10-07 (×3): 500 mg via ORAL
  Filled 2013-10-06 (×4): qty 1

## 2013-10-06 MED ORDER — MORPHINE SULFATE 2 MG/ML IJ SOLN
1.0000 mg | INTRAMUSCULAR | Status: DC | PRN
  Administered 2013-10-06: 1 mg via INTRAVENOUS
  Filled 2013-10-06: qty 1

## 2013-10-06 MED ORDER — OXYCODONE HCL 5 MG/5ML PO SOLN: 5.0000 mg | Freq: Once | ORAL | Status: DC | PRN

## 2013-10-06 MED ORDER — PROPOFOL 10 MG/ML IV BOLUS
INTRAVENOUS | Status: DC | PRN
Start: 2013-10-06 — End: 2013-10-06
  Administered 2013-10-06: 200 mg via INTRAVENOUS
  Administered 2013-10-06: 50 mg via INTRAVENOUS

## 2013-10-06 MED ORDER — WARFARIN SODIUM 7.5 MG PO TABS
7.5000 mg | ORAL_TABLET | Freq: Once | ORAL | Status: AC
  Administered 2013-10-06: 7.5 mg via ORAL
  Filled 2013-10-06: qty 1

## 2013-10-06 SURGICAL SUPPLY — 74 items
APL SKNCLS STERI-STRIP NONHPOA (GAUZE/BANDAGES/DRESSINGS) ×1
BANDAGE ELASTIC 6 VELCRO ST LF (GAUZE/BANDAGES/DRESSINGS) IMPLANT
BANDAGE ESMARK 6X9 LF (GAUZE/BANDAGES/DRESSINGS) IMPLANT
BENZOIN TINCTURE PRP APPL 2/3 (GAUZE/BANDAGES/DRESSINGS) ×1 IMPLANT
BLADE CUDA 5.5 (BLADE) IMPLANT
BLADE GREAT WHITE 4.2 (BLADE) IMPLANT
BLADE SURG ROTATE 9660 (MISCELLANEOUS) IMPLANT
BNDG CMPR 9X6 STRL LF SNTH (GAUZE/BANDAGES/DRESSINGS)
BNDG CMPR MED 15X6 ELC VLCR LF (GAUZE/BANDAGES/DRESSINGS) ×1
BNDG ELASTIC 6X15 VLCR STRL LF (GAUZE/BANDAGES/DRESSINGS) ×2 IMPLANT
BNDG ESMARK 6X9 LF (GAUZE/BANDAGES/DRESSINGS)
BUR OVAL 6.0 (BURR) IMPLANT
CLSR STERI-STRIP ANTIMIC 1/2X4 (GAUZE/BANDAGES/DRESSINGS) ×1 IMPLANT
COVER SURGICAL LIGHT HANDLE (MISCELLANEOUS) ×2 IMPLANT
CUFF TOURNIQUET SINGLE 34IN LL (TOURNIQUET CUFF) IMPLANT
CUFF TOURNIQUET SINGLE 44IN (TOURNIQUET CUFF) IMPLANT
DRAPE ARTHROSCOPY W/POUCH 114 (DRAPES) ×2 IMPLANT
DRAPE INCISE IOBAN 66X45 STRL (DRAPES) IMPLANT
DRAPE PROXIMA HALF (DRAPES) IMPLANT
DRAPE U-SHAPE 47X51 STRL (DRAPES) ×2 IMPLANT
DRSG PAD ABDOMINAL 8X10 ST (GAUZE/BANDAGES/DRESSINGS) ×1 IMPLANT
DURAPREP 26ML APPLICATOR (WOUND CARE) ×2 IMPLANT
FUNNEL (MISCELLANEOUS) ×1 IMPLANT
GAUZE XEROFORM 1X8 LF (GAUZE/BANDAGES/DRESSINGS) IMPLANT
GAUZE XEROFORM 5X9 LF (GAUZE/BANDAGES/DRESSINGS) ×1 IMPLANT
GLOVE BIO SURGEON STRL SZ 6.5 (GLOVE) ×1 IMPLANT
GLOVE BIOGEL PI IND STRL 6.5 (GLOVE) IMPLANT
GLOVE BIOGEL PI IND STRL 8 (GLOVE) ×1 IMPLANT
GLOVE BIOGEL PI INDICATOR 6.5 (GLOVE) ×1
GLOVE BIOGEL PI INDICATOR 8 (GLOVE) ×1
GLOVE SURG ORTHO 8.0 STRL STRW (GLOVE) ×2 IMPLANT
GOWN STRL REUS W/ TWL LRG LVL3 (GOWN DISPOSABLE) ×2 IMPLANT
GOWN STRL REUS W/ TWL XL LVL3 (GOWN DISPOSABLE) ×1 IMPLANT
GOWN STRL REUS W/TWL LRG LVL3 (GOWN DISPOSABLE) ×4
GOWN STRL REUS W/TWL XL LVL3 (GOWN DISPOSABLE) ×2
GRAFT TISSUE BIOCARTILAGE 1ML (Tissue) ×4 IMPLANT
IMMOBILIZER KNEE 22 UNIV (SOFTGOODS) ×1 IMPLANT
KIT BASIN OR (CUSTOM PROCEDURE TRAY) ×2 IMPLANT
KIT BIOCARTILAGE DEL W/SYRINGE (KITS) ×1 IMPLANT
KIT PLATELET ACP SERIES I (KITS) ×1 IMPLANT
KIT ROOM TURNOVER OR (KITS) ×2 IMPLANT
MANIFOLD NEPTUNE II (INSTRUMENTS) IMPLANT
NDL 18GX1X1/2 (RX/OR ONLY) (NEEDLE) IMPLANT
NDL HYPO 25GX1X1/2 BEV (NEEDLE) ×1 IMPLANT
NEEDLE 18GX1X1/2 (RX/OR ONLY) (NEEDLE) IMPLANT
NEEDLE HYPO 25GX1X1/2 BEV (NEEDLE) ×2 IMPLANT
NS IRRIG 1000ML POUR BTL (IV SOLUTION) IMPLANT
PACK ARTHROSCOPY DSU (CUSTOM PROCEDURE TRAY) ×2 IMPLANT
PAD ARMBOARD 7.5X6 YLW CONV (MISCELLANEOUS) ×4 IMPLANT
PADDING CAST COTTON 6X4 STRL (CAST SUPPLIES) ×2 IMPLANT
PLUERISTICK MICROFX CASE (MISCELLANEOUS) ×2 IMPLANT
SET ARTHROSCOPY TUBING (MISCELLANEOUS) ×2
SET ARTHROSCOPY TUBING LN (MISCELLANEOUS) ×1 IMPLANT
SPONGE GAUZE 4X4 12PLY (GAUZE/BANDAGES/DRESSINGS) IMPLANT
SPONGE GAUZE 4X4 12PLY STER LF (GAUZE/BANDAGES/DRESSINGS) ×1 IMPLANT
SPONGE LAP 4X18 X RAY DECT (DISPOSABLE) ×2 IMPLANT
SUT ETHILON 3 0 PS 1 (SUTURE) IMPLANT
SUT MENISCAL KIT (KITS) IMPLANT
SUT PROLENE 3 0 PS 2 (SUTURE) ×1 IMPLANT
SUT VIC AB 0 CT1 27 (SUTURE) ×2
SUT VIC AB 0 CT1 27XBRD ANBCTR (SUTURE) IMPLANT
SUT VIC AB 1 CT1 27 (SUTURE) ×4
SUT VIC AB 1 CT1 27XBRD ANBCTR (SUTURE) IMPLANT
SUT VIC AB 2-0 CT1 27 (SUTURE) ×2
SUT VIC AB 2-0 CT1 TAPERPNT 27 (SUTURE) IMPLANT
SYR 20ML ECCENTRIC (SYRINGE) ×2 IMPLANT
SYR CONTROL 10ML LL (SYRINGE) IMPLANT
SYR TB 1ML LUER SLIP (SYRINGE) ×2 IMPLANT
THUMB TAB DISPOSABLE (MISCELLANEOUS) ×1 IMPLANT
TOWEL OR 17X24 6PK STRL BLUE (TOWEL DISPOSABLE) ×2 IMPLANT
TOWEL OR 17X26 10 PK STRL BLUE (TOWEL DISPOSABLE) ×2 IMPLANT
TUBE CONNECTING 12X1/4 (SUCTIONS) ×2 IMPLANT
WAND HAND CNTRL MULTIVAC 90 (MISCELLANEOUS) ×2 IMPLANT
WATER STERILE IRR 1000ML POUR (IV SOLUTION) ×2 IMPLANT

## 2013-10-06 NOTE — Transfer of Care (Signed)
Immediate Anesthesia Transfer of Care Note  Patient: [name]  Procedure(s) Performed: Procedure(s): KNEE ARTHROSCOPY WITH DRILLING/MICROFRACTURE AND ALLOGRAFT CARTLAGE IMPLANTATION, CHONDRAPLASTY OF TROCHIAL AND PATELLA (Left)  Patient Location: PACU  Anesthesia Type:General  Level of Consciousness: awake and alert   Airway & Oxygen Therapy: Patient Spontanous Breathing and Patient connected to nasal cannula oxygen  Post-op Assessment: Report given to PACU RN, Post -op Vital signs reviewed and stable and Patient moving all extremities X 4  Post vital signs: Reviewed and stable  Complications: No apparent anesthesia complications

## 2013-10-06 NOTE — H&P (Signed)
[name] is an 48 y.o. female.   Chief Complaint: Left knee pain HPI: [name] is a 48 year old patient with left knee pain. Pain has been on for a couple of months. Developed mechanical symptoms and swelling. Had an injection in clinic which helped on for several days and swelling recurred. 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 Patient works as a Engineer, siteschool teacher and this is affecting her activities of daily living as well as her teaching. Denies any previous injury to the left knee. No family history of DVT or pulmonary embolism  Past Medical History  Diagnosis Date  . Allergy   . Bursitis of shoulder, right   . Knee pain, bilateral     intermittent pain, ran track in high school  . Wisdom teeth extracted   . Routine gynecological examination     Dr. Henderson CloudHorvath  . History of mammogram 2014    diagnostic, benign findings  . Heart murmur     as a child  . GERD (gastroesophageal reflux disease)   . Arthritis     knees    Past Surgical History  Procedure Laterality Date  . Knee surgery  1989    tendonitis, right knee;   . Tubal ligation      Family History  Problem Relation Age of Onset  . Hypertension Mother   . Kidney disease Mother     hypertensive kidney failure, renal transplant  . Diabetes Mother   . Hypertension Father   . Fibroids Sister   . Cancer Neg Hx   . Heart disease Neg Hx   . Stroke Neg Hx    Social History:  reports that she has never smoked. She has never used smokeless tobacco. She reports that she does not drink alcohol or use illicit drugs.  Allergies: No Known Allergies  No prescriptions prior to admission    No results found for this or any previous visit (from the past 48 hour(s)). No results found.  Review of Systems  Constitutional: Negative.   HENT: Negative.   Eyes: Negative.   Respiratory: Negative.   Cardiovascular: Negative.   Gastrointestinal: Negative.   Genitourinary:  Negative.   Musculoskeletal: Positive for joint pain.  Skin: Negative.   Neurological: Negative.   Endo/Heme/Allergies: Negative.   Psychiatric/Behavioral: Negative.     There were no vitals taken for this visit. Physical Exam  Constitutional: She appears well-developed.  HENT:  Head: Normocephalic.  Eyes: Pupils are equal, round, and reactive to light.  Neck: Normal range of motion.  Cardiovascular: Normal rate.   Respiratory: Effort normal.  Neurological: She is alert.  Skin: Skin is warm.  Psychiatric: She has a normal mood and affect.   semination left knee demonstrate stable collateral crucial ligaments effusion in [place] mechanism skin is intact in the left knee region pedal pulses palpable no groin pain with internal extra rotation leg no nerve retention signs range of motion full medial and lateral joint line tenderness is present  Assessment/Plan Impression is left knee full-thickness chondral defect with no evidence of tibial defect. Plan arthroscopy evaluation of the defect possible graft harvest for ophthalmoscope side implantation versus allograft and fibrin glue implantation through medial or patellar arthrotomy. Alternatively mosaic plasty may be considered potentially microfracture if this is a small defect. Risk and benefits of the procedure discussed with the patient including potentially the need for more surgical intervention. Patient understands risk and benefits and wishes to proceed potential for extended rehabilitation also discussed  plan for aspirin postop for DVT prophylaxis  DEAN,GREGORY SCOTT 10/06/2013, 7:27 AM

## 2013-10-06 NOTE — Anesthesia Postprocedure Evaluation (Signed)
  Anesthesia Post-op Note  Patient: [name]  Procedure(s) Performed: Procedure(s): KNEE ARTHROSCOPY WITH DRILLING/MICROFRACTURE AND ALLOGRAFT CARTLAGE IMPLANTATION, CHONDRAPLASTY OF TROCHIAL AND PATELLA (Left)  Patient Location: PACU  Anesthesia Type:General  Level of Consciousness: awake, alert , oriented and patient cooperative  Airway and Oxygen Therapy: Patient Spontanous Breathing and Patient connected to nasal cannula oxygen  Post-op Pain: mild  Post-op Assessment: Post-op Vital signs reviewed, Patient's Cardiovascular Status Stable, Respiratory Function Stable, Patent Airway, No signs of Nausea or vomiting and Pain level controlled  Post-op Vital Signs: Reviewed and stable  Last Vitals:  Filed Vitals:   10/06/13 1630  BP:   Pulse: 83  Temp:   Resp: 15    Complications: No apparent anesthesia complications

## 2013-10-06 NOTE — Brief Op Note (Signed)
10/06/2013  4:07 PM  PATIENT:  [name]  48 y.o. female  PRE-OPERATIVE DIAGNOSIS:  LEFT KNEE CHONDRAL DEFECT  POST-OPERATIVE DIAGNOSIS:  LEFT KNEE CHONDRAL DEFECT  PROCEDURE:  Procedure(s): KNEE ARTHROSCOPY WITH DRILLING/MICROFRACTURE AND ALLOGRAFT CARTLAGE IMPLANTATION, CHONDRAPLASTY OF TROCHIAL AND PATELLA  SURGEON:  Surgeon(s): Cammy CopaGregory Scott Dean, MD  ASSISTANT: none  ANESTHESIA:   general  EBL: 50 ml    Total I/O In: 1250 [I.V.:1250] Out: 50 [Blood:50]  BLOOD ADMINISTERED: none  DRAINS: none   LOCAL MEDICATIONS USED:  none  SPECIMEN:  No Specimen  COUNTS:  YES  TOURNIQUET:   Total Tourniquet Time Documented: Thigh (Left) - 98 minutes Total: Thigh (Left) - 98 minutes   DICTATION: .Other Dictation: Dictation Number [id number]  PLAN OF CARE: Admit for overnight observation  PATIENT DISPOSITION:  PACU - hemodynamically stable

## 2013-10-06 NOTE — Anesthesia Procedure Notes (Signed)
Procedure Name: LMA Insertion Date/Time: 10/06/2013 1:31 PM Performed by: Wray KearnsFOLEY, [name] Pre-anesthesia Checklist: Patient identified, Emergency Drugs available, Timeout performed, Suction available and Patient being monitored Patient Re-evaluated:Patient Re-evaluated prior to inductionOxygen Delivery Method: Circle system utilized Preoxygenation: Pre-oxygenation with 100% oxygen Intubation Type: IV induction Ventilation: Mask ventilation without difficulty LMA: LMA inserted LMA Size: 4.0 Tube type: Oral Number of attempts: 1 Placement Confirmation: breath sounds checked- equal and bilateral and positive ETCO2 Tube secured with: Tape Dental Injury: Teeth and Oropharynx as per pre-operative assessment

## 2013-10-06 NOTE — Progress Notes (Signed)
ANTICOAGULATION CONSULT NOTE - Initial Consult  Pharmacy Consult for Warfarin  Indication: VTE prophylaxis s/p knee arthroplasty   No Known Allergies  Patient Measurements: Height: 5' 1.5" (156.2 cm) Weight: 169 lb (76.658 kg) IBW/kg (Calculated) : 48.95   Vital Signs: Temp: 97 F (36.1 C) (06/30 1813) Temp src: Oral (06/30 1813) BP: 120/82 mmHg (06/30 1813) Pulse Rate: 70 (06/30 1813)  Labs: No results found for this basename: HGB, HCT, PLT, APTT, LABPROT, INR, HEPARINUNFRC, CREATININE, CKTOTAL, CKMB, TROPONINI,  in the last 72 hours  Estimated Creatinine Clearance: 83.4 ml/min (by C-G formula based on Cr of 0.79).   Medical History: Past Medical History  Diagnosis Date  . Allergy   . Bursitis of shoulder, right   . Knee pain, bilateral     intermittent pain, ran track in high school  . Wisdom teeth extracted   . Routine gynecological examination     Dr. Henderson CloudHorvath  . History of mammogram 2014    diagnostic, benign findings  . Heart murmur     as a child  . GERD (gastroesophageal reflux disease)   . Arthritis     knees    Medications:  Prescriptions prior to admission  Medication Sig Dispense Refill  . esomeprazole (NEXIUM) 40 MG capsule Take 40 mg by mouth daily as needed.      . Multiple Vitamins-Minerals (MULTIVITAMIN WITH MINERALS) tablet Take 1 tablet by mouth daily. gummy      . [DISCONTINUED] esomeprazole (NEXIUM) 40 MG capsule Take 1 capsule (40 mg total) by mouth daily.  30 capsule  0    Assessment: 47 YOF s/p left knee arthroplasty to start on Coumadin for VTE prophylaxis. H/H mildly low. Plt wnl.   Goal of Therapy:  INR 2-3 Monitor platelets by anticoagulation protocol: Yes   Plan:  1) Give Coumadin 7.5 mg x 1 dose today 2) Monitor daily INR and s/s of bleeding   Vinnie LevelBenjamin Mancheril, PharmD.  Clinical Pharmacist Pager [phone]

## 2013-10-06 NOTE — Anesthesia Preprocedure Evaluation (Signed)
Anesthesia Evaluation  Patient identified by MRN, date of birth, ID band Patient awake    Reviewed: Allergy & Precautions, H&P , NPO status , Patient's Chart, lab work & pertinent test results, reviewed documented beta blocker date and time   Airway Mallampati: II TM Distance: >3 FB Neck ROM: full    Dental   Pulmonary neg pulmonary ROS,  breath sounds clear to auscultation        Cardiovascular negative cardio ROS  Rhythm:regular     Neuro/Psych negative neurological ROS  negative psych ROS   GI/Hepatic Neg liver ROS, GERD-  Medicated and Controlled,  Endo/Other  negative endocrine ROS  Renal/GU negative Renal ROS  negative genitourinary   Musculoskeletal   Abdominal   Peds  Hematology negative hematology ROS (+)   Anesthesia Other Findings See surgeon's H&P   Reproductive/Obstetrics negative OB ROS                           Anesthesia Physical Anesthesia Plan  ASA: II  Anesthesia Plan: General   Post-op Pain Management:    Induction: Intravenous  Airway Management Planned: LMA  Additional Equipment:   Intra-op Plan:   Post-operative Plan: Extubation in OR  Informed Consent: I have reviewed the patients History and Physical, chart, labs and discussed the procedure including the risks, benefits and alternatives for the proposed anesthesia with the patient or authorized representative who has indicated his/her understanding and acceptance.   Dental Advisory Given  Plan Discussed with: CRNA and Surgeon  Anesthesia Plan Comments:         Anesthesia Quick Evaluation

## 2013-10-07 ENCOUNTER — Encounter (HOSPITAL_COMMUNITY): Payer: Self-pay | Admitting: Orthopedic Surgery

## 2013-10-07 LAB — PROTIME-INR
INR: 1.15 (ref 0.00–1.49)
PROTHROMBIN TIME: 14.7 s (ref 11.6–15.2)

## 2013-10-07 MED ORDER — OXYCODONE HCL 5 MG PO TABS: 5.0000 mg | ORAL_TABLET | ORAL | Status: DC | PRN

## 2013-10-07 MED ORDER — METHOCARBAMOL 500 MG PO TABS: 500.0000 mg | ORAL_TABLET | Freq: Four times a day (QID) | ORAL | Status: DC | PRN

## 2013-10-07 MED ORDER — WARFARIN SODIUM 5 MG PO TABS: 5.0000 mg | ORAL_TABLET | Freq: Every day | ORAL | Status: DC

## 2013-10-07 MED ORDER — DSS 100 MG PO CAPS: 100.0000 mg | ORAL_CAPSULE | Freq: Two times a day (BID) | ORAL | Status: DC

## 2013-10-07 NOTE — Progress Notes (Addendum)
Patient discharged to home accompanied by husband. Discharge instructions and rx given and explained and patient stated understanding. IV was removed, dressing was changed and patient left unit in a stable condition with all personal belongings via wheelchair.

## 2013-10-07 NOTE — Evaluation (Signed)
Physical Therapy Evaluation Patient Details Name: [name] MRN: [id number] DOB: 07/27/1965 Today's Date: 10/07/2013   History of Present Illness  pt presents post L knee scope with chondroplasty and cartilage implant.    Clinical Impression  Pt and husband education on mobility and safety.  Pt moving well and is ready for D/C from PT stand point.  No further acute PT needs at this time.      Follow Up Recommendations Supervision for mobility/OOB    Equipment Recommendations  Rolling walker with 5" wheels;3in1 (PT)    Recommendations for Other Services       Precautions / Restrictions Precautions Precautions: Fall Restrictions Weight Bearing Restrictions: Yes LLE Weight Bearing: Non weight bearing      Mobility  Bed Mobility Overal bed mobility: Needs Assistance Bed Mobility: Supine to Sit     Supine to sit: Min assist;HOB elevated     General bed mobility comments: A with L LE only.    Transfers Overall transfer level: Needs assistance Equipment used: Rolling walker (2 wheeled) Transfers: Sit to/from Stand Sit to Stand: Min guard         General transfer comment: cues for UE use and positioning L LE.    Ambulation/Gait Ambulation/Gait assistance: Min guard Ambulation Distance (Feet): 50 Feet Assistive device: Rolling walker (2 wheeled) Gait Pattern/deviations: Step-to pattern     General Gait Details: cues for sue of RW, upright psoture, and positioning of L LE.    Stairs Stairs: Yes Stairs assistance: Min assist Stair Management: No rails;Step to pattern;Backwards;With walker Number of Stairs: 5 General stair comments: cues for pt and husband on safe technique.  pt and husband performed well together.    Wheelchair Mobility    Modified Rankin (Stroke Patients Only)       Balance                                             Pertinent Vitals/Pain L LE "Tight and heavy"  Premedicated.      Home Living  Family/patient expects to be discharged to:: Private residence Living Arrangements: Spouse/significant other;Children;Other relatives Available Help at Discharge: Family;Available 24 hours/day Type of Home: House Home Access: Stairs to enter Entrance Stairs-Rails: Right Entrance Stairs-Number of Steps: 4 Home Layout: Multi-level Home Equipment: None      Prior Function Level of Independence: Independent               Hand Dominance        Extremity/Trunk Assessment   Upper Extremity Assessment: Overall WFL for tasks assessed           Lower Extremity Assessment: LLE deficits/detail      Cervical / Trunk Assessment: Normal  Communication   Communication: No difficulties  Cognition Arousal/Alertness: Awake/alert Behavior During Therapy: WFL for tasks assessed/performed Overall Cognitive Status: Within Functional Limits for tasks assessed                      General Comments      Exercises        Assessment/Plan    PT Assessment Patent does not need any further PT services  PT Diagnosis     PT Problem List    PT Treatment Interventions     PT Goals (Current goals can be found in the Care Plan section) Acute Rehab PT Goals PT Goal Formulation:  No goals set, d/c therapy    Frequency     Barriers to discharge        Co-evaluation               End of Session Equipment Utilized During Treatment: Gait belt;Left knee immobilizer Activity Tolerance: Patient tolerated treatment well Patient left: in chair;with call bell/phone within reach Nurse Communication: Mobility status    Functional Assessment Tool Used: Clinical Judgement Functional Limitation: Mobility: Walking and moving around Mobility: Walking and Moving Around Current Status ([id number](G8978): At least 1 percent but less than 20 percent impaired, limited or restricted Mobility: Walking and Moving Around Goal Status [phone](G8979): At least 1 percent but less than 20 percent impaired,  limited or restricted Mobility: Walking and Moving Around Discharge Status [phone](G8980): At least 1 percent but less than 20 percent impaired, limited or restricted    Time: 1006-1030 PT Time Calculation (min): 24 min   Charges:   PT Evaluation $Initial PT Evaluation Tier I: 1 Procedure PT Treatments $Gait Training: 8-22 mins   PT G Codes:   Functional Assessment Tool Used: Clinical Judgement Functional Limitation: Mobility: Walking and moving around    Sunny SchleinRitenour, Xzavier Swinger F, [place] CarolinaPT [phone] 10/07/2013, 10:53 AM

## 2013-10-07 NOTE — Progress Notes (Signed)
Subjective: Pt stable - pain ok   Objective: Vital signs in last 24 hours: Temp:  [97 F (36.1 C)-98.2 F (36.8 C)] 98.2 F (36.8 C) (07/01 0444) Pulse Rate:  [70-94] 70 (07/01 0444) Resp:  [9-20] 16 (07/01 0444) BP: (106-135)/(48-91) 108/48 mmHg (07/01 0444) SpO2:  [98 %-100 %] 98 % (07/01 0444) Weight:  [76.658 kg (169 lb)] 76.658 kg (169 lb) (06/30 1211)  Intake/Output from previous day: 06/30 0701 - 07/01 0700 In: 1250 [I.V.:1250] Out: 50 [Blood:50] Intake/Output this shift:    Exam:  Dorsiflexion/Plantar flexion intact Compartment soft  Labs: No results found for this basename: HGB,  in the last 72 hours No results found for this basename: WBC, RBC, HCT, PLT,  in the last 72 hours No results found for this basename: NA, K, CL, CO2, BUN, CREATININE, GLUCOSE, CALCIUM,  in the last 72 hours  Recent Labs  10/07/13 0605  INR 1.15    Assessment/Plan: Plan dc today   DEAN,GREGORY SCOTT 10/07/2013, 7:13 AM

## 2013-10-07 NOTE — Progress Notes (Signed)
ANTICOAGULATION CONSULT NOTE - FOLLOW UP  Pharmacy Consult:  Coumadin Indication: VTE prophylaxis s/p knee arthroplasty   No Known Allergies  Patient Measurements: Height: 5' 1.5" (156.2 cm) Weight: 169 lb (76.658 kg) IBW/kg (Calculated) : 48.95  Vital Signs: Temp: 98.2 F (36.8 C) (07/01 0444) Temp src: Oral (07/01 0444) BP: 108/48 mmHg (07/01 0444) Pulse Rate: 70 (07/01 0444)  Labs:  Recent Labs  10/07/13 0605  LABPROT 14.7  INR 1.15    Estimated Creatinine Clearance: 83.4 ml/min (by C-G formula based on Cr of 0.79).    Assessment: [age] YOF s/p left knee arthroplasty to continue on Coumadin for VTE prophylaxis.  Patient's INR remains sub-therapeutic as expected.  No bleeding reported.   Goal of Therapy:  INR 2-3    Plan:  - Repeat Coumadin 7.5mg  PO today  - Daily PT / INR    Ambyr Qadri D. Laney Potashang, PharmD, BCPS Pager:  [phone] 10/07/2013, 12:30 PM

## 2013-10-07 NOTE — Op Note (Signed)
NAMMercie Eon:  [name], [name]       ACCOUNT NO.:  [id number]  MEDICAL RECORD NO.:  [id number]  LOCATION:  [id number]                        FACILITY:  [facility]  PHYSICIAN:  Burnard BuntingG. Scott Dean, M.D.    DATE OF BIRTH:  12/24/1965  DATE OF PROCEDURE: DATE OF DISCHARGE:                              OPERATIVE REPORT   PREOPERATIVE DIAGNOSIS:  Left knee chondral defect, medial femoral condyle.  POSTOPERATIVE DIAGNOSES: 1. Left knee chondral defect, medial femoral condyle, measuring about     25 x 15 mm. 2. 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  PROCEDURE:  Left knee arthroscopy, chondroplasty of trochlea and undersurface of patella of loose flaps, grade 2-3 chondromalacia with open arthrotomy and allograft implantation cells to fill the medial femoral condyle chondral defect.  SURGEON:  Burnard BuntingG. Scott Dean, M.D.  ASSISTANT:  None.  ANESTHESIA:  General.  INDICATIONS:  [name] is a patient with left knee pain, swelling, effusion.  MRI scan shows chondral defect in medial femoral condyle, presents now for operative management after explanation of risks and benefits.  OPERATIVE FINDINGS: 1. Examination under anesthesia, range of motion 0-140 with stability     varus and valgus stress.  ACL, PCL intact.  No posterolateral     rotatory instability is noted. 2. Diagnostic arthroscopy.     a.     Grade 2-3 undersurface of patella, chondromalacia, loose      chondral flaps at the junction of medial and lateral facets.     b.     Grade 3 chondromalacia with landing zone of the trochlea.     c.     Intact lateral compartment articular cartilage, and      meniscus.     d.     Intact medial compartment meniscus with chondral defect,      full thickness, measuring approximately 21 x 15 mm.  ACL, PCL      intact.  PROCEDURE IN DETAIL:  The patient was brought to the operating room, where general anesthesia was induced.  Preoperative antibiotics  were administered.  Time-out was called.  Left leg was elevated.  Left leg was prescrubbed with alcohol and Betadine, prepped with DuraPrep solution and draped in a sterile manner.  Total tourniquet time 90 minutes at 300 mmHg for the 1st hour and 350 for the 2nd 30 minutes. Anterior-inferior lateral portal was established.  Anterior-inferior medial portal was established.  Under direct visualization, diagnostic arthroscopy was performed.  The patient did have unexpected finding of fairly significant chondromalacia with the landing zone of the trochlea and undersurface of the patella.  This was debrided using a shaver. Lateral compartment was intact.  ACL and PCL intact.  Medial compartment demonstrated significant chondral defect on the weightbearing surface. This defect was prepared using a ring curette to remove the calcified cartilage layer along with loose chondral flaps circumferentially. There was a good edge circumferentially to the defect.  At this time following preparation, 5 cm arthrotomy was made in the medial aspect of the patellar tendon.  Skin and subcutaneous tissue were sharply divided. The microfracture was then performed under direct visualization.  The 10 mL of the patient's blood was spun down and  direct traction of the spun blood was used to mix with allograft cells.  This was then put in a slurry formed into the contained defect, which was then sealed with fibrin glue.  Following allowing 5 minutes of fibrin glue to seal, the knee was taken into full extension, prior to placing the fibrin glue in the graft, thorough irrigation of the knee joint was performed.  Once the knee was in extension, arthrotomy was closed and then the tourniquet was released.  The skin was closed using interrupted inverted 0 Vicryl suture, 2-0 Vicryl suture, and 3-0 Prolene.  A 3-0 nylon was then used to reapproximate the portal incision.  The patient was placed in a bulky dressing and knee  immobilizer.  She will be nonweightbearing, locked in full extension for the 1st week and then allow range of motion exercises.  The patient tolerated the procedure well without immediate complications.  Transferred to recovery room in stable condition.     Burnard BuntingG. Scott Dean, M.D.     GSD/MEDQ  D:  10/06/2013  T:  10/07/2013  Job:  [id number]

## 2013-10-15 ENCOUNTER — Encounter (HOSPITAL_COMMUNITY): Payer: Self-pay | Admitting: Orthopedic Surgery

## 2013-10-16 ENCOUNTER — Encounter (HOSPITAL_COMMUNITY): Payer: Self-pay | Admitting: Orthopedic Surgery

## 2013-11-24 ENCOUNTER — Other Ambulatory Visit (HOSPITAL_COMMUNITY): Payer: Self-pay | Admitting: Orthopedic Surgery

## 2014-11-18 ENCOUNTER — Encounter: Payer: Self-pay | Admitting: Medical

## 2014-11-18 ENCOUNTER — Ambulatory Visit (INDEPENDENT_AMBULATORY_CARE_PROVIDER_SITE_OTHER): Payer: BLUE CROSS/BLUE SHIELD | Admitting: Medical

## 2014-11-18 VITALS — BP 118/76 | HR 72 | Temp 97.9°F | Resp 15 | Ht 61.5 in | Wt 164.0 lb

## 2014-11-18 DIAGNOSIS — Z23 Encounter for immunization: Secondary | ICD-10-CM | POA: Diagnosis not present

## 2014-11-18 DIAGNOSIS — L989 Disorder of the skin and subcutaneous tissue, unspecified: Secondary | ICD-10-CM | POA: Diagnosis not present

## 2014-11-18 DIAGNOSIS — Z0000 Encounter for general adult medical examination without abnormal findings: Secondary | ICD-10-CM | POA: Diagnosis not present

## 2014-11-18 DIAGNOSIS — M545 Low back pain: Secondary | ICD-10-CM

## 2014-11-18 LAB — CBC
HCT: 36.2 % (ref 36.0–46.0)
Hemoglobin: 12.6 g/dL (ref 12.0–15.0)
MCH: 29.7 pg (ref 26.0–34.0)
MCHC: 34.8 g/dL (ref 30.0–36.0)
MCV: 85.4 fL (ref 78.0–100.0)
MPV: 8.5 fL — ABNORMAL LOW (ref 8.6–12.4)
PLATELETS: 287 10*3/uL (ref 150–400)
RBC: 4.24 MIL/uL (ref 3.87–5.11)
RDW: 13.4 % (ref 11.5–15.5)
WBC: 4.4 10*3/uL (ref 4.0–10.5)

## 2014-11-18 LAB — HEMOGLOBIN A1C
Hgb A1c MFr Bld: 5.7 % — ABNORMAL HIGH (ref <5.7)
MEAN PLASMA GLUCOSE: 117 mg/dL — AB (ref <117)

## 2014-11-18 LAB — COMPREHENSIVE METABOLIC PANEL
ALT: 14 U/L (ref 6–29)
AST: 17 U/L (ref 10–35)
Albumin: 4.3 g/dL (ref 3.6–5.1)
Alkaline Phosphatase: 63 U/L (ref 33–115)
BUN: 13 mg/dL (ref 7–25)
CO2: 27 mmol/L (ref 20–31)
Calcium: 9.4 mg/dL (ref 8.6–10.2)
Chloride: 108 mmol/L (ref 98–110)
Creat: 0.9 mg/dL (ref 0.50–1.10)
Glucose, Bld: 79 mg/dL (ref 65–99)
Potassium: 4.5 mmol/L (ref 3.5–5.3)
Sodium: 143 mmol/L (ref 135–146)
TOTAL PROTEIN: 7.7 g/dL (ref 6.1–8.1)
Total Bilirubin: 0.4 mg/dL (ref 0.2–1.2)

## 2014-11-18 LAB — POCT URINALYSIS DIPSTICK
BILIRUBIN UA: NEGATIVE
Glucose, UA: NEGATIVE
KETONES UA: NEGATIVE
Leukocytes, UA: NEGATIVE
Nitrite, UA: NEGATIVE
PH UA: 5.5
Protein, UA: NEGATIVE
Spec Grav, UA: >=1.03
Urobilinogen, UA: NEGATIVE

## 2014-11-18 LAB — LIPID PANEL
CHOLESTEROL: 201 mg/dL — AB (ref 125–200)
HDL: 75 mg/dL (ref >=46)
LDL Cholesterol: 106 mg/dL (ref <130)
Total CHOL/HDL Ratio: 2.7 Ratio (ref <=5.0)
Triglycerides: 98 mg/dL (ref <150)
VLDL: 20 mg/dL (ref <30)

## 2014-11-18 MED ORDER — METHOCARBAMOL 500 MG PO TABS
500.0000 mg | ORAL_TABLET | Freq: Four times a day (QID) | ORAL | Status: DC | PRN
Start: 2014-11-18 — End: 2015-12-30

## 2014-11-18 NOTE — Progress Notes (Signed)
Subjective:   HPI  [name] is a 49 y.o. female who presents for a complete physical.  Medical care team includes: [facility] TYSINGER, DAVID Vincenza Hews, PA-C here for primary care   Preventative care:2015 Last ophthalmology visit: 2 years ago [facility] Last dental visit:[address] Last colonoscopy:never Last mammogram:2015 has appt Last gynecological exam:2015 Last ZOX:[facility] Last labs:2015 Prior vaccinations: TD or Tdap:current [facility] Influenza:2015 Pneumococcal: no Shingles/Zostavax:no  Advanced directive:no Health care power of attorney:no Living will:no  Concerns: Left lower back pain x 2 months.  Is continuous, feels like a fist in her left mid to low back, only feels it lying town. .  Nothing seems to make it better other than not lying on it.   She does stress, she exercises.   Not prone to UTIs.  No recent fall, trauma, or strenuous activity.  Had hoarseness last year on exam, but this resolved.  Reviewed their medical, surgical, family, social, medication, and allergy history and updated chart as appropriate.  Past Medical History  Diagnosis Date  . Allergy   . Bursitis of shoulder, right   . Knee pain, bilateral     intermittent pain, ran track in high school; OA  . Wisdom teeth extracted   . Routine gynecological examination     Dr. Henderson Cloud  . Heart murmur     as a child  . GERD (gastroesophageal reflux disease)   . Obesity     Past Surgical History  Procedure Laterality Date  . Knee surgery  1989    tendonitis, right knee;   . Tubal ligation    . Knee arthroscopy with drilling/microfracture Left 10/06/2013    Procedure: KNEE ARTHROSCOPY WITH DRILLING/MICROFRACTURE AND ALLOGRAFT CARTLAGE IMPLANTATION, CHONDRAPLASTY OF TROCHIAL AND PATELLA;  Surgeon: Cammy Copa, MD;  Location: [facility] OR;  Service: Orthopedics;  Laterality: Left;    Social History   Social History  . Marital Status: Married   Spouse Name: N/A  . Number of Children: N/A  . Years of Education: N/A   Occupational History  . Not on file.   Social History Main Topics  . Smoking status: Never Smoker   . Smokeless tobacco: Never Used  . Alcohol Use: No  . Drug Use: No  . Sexual Activity: Not on file   Other Topics Concern  . 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  Hobbies - walking, makes jewelry, likes adult coloring books    Family History  Problem Relation Age of Onset  . Hypertension Mother   . Kidney disease Mother     hypertensive kidney failure, renal transplant  . Diabetes Mother   . Hypertension Father   . Sleep apnea Father   . Fibroids Sister   . Cancer Neg Hx   . Heart disease Neg Hx   . Stroke Neg Hx      Current outpatient prescriptions:  Marland Kitchen  Multiple Vitamins-Minerals (MULTI-VITAMIN GUMMIES PO), Take by mouth., Disp: , Rfl:  .  methocarbamol (ROBAXIN) 500 MG tablet, Take 1 tablet (500 mg total) by mouth every 6 (six) hours as needed for muscle spasms., Disp: 30 tablet, Rfl: 0  No Known Allergies   Review of Systems Constitutional: -fever, -chills, -sweats, -unexpected weight change, -decreased appetite, +fatigue Allergy: -sneezing, -itching, -congestion Dermatology: +changing moles, --rash, -lumps ENT: -runny nose, -  ear pain, -sore throat, -hoarseness, -sinus pain, -teeth pain, - ringing in ears, -hearing loss, -nosebleeds Cardiology: -chest pain, -palpitations, -swelling, -difficulty breathing when lying flat, -waking up short of breath Respiratory: -cough, -shortness of breath, -difficulty breathing with exercise or exertion, -wheezing, -coughing up blood Gastroenterology: -abdominal pain, -nausea, -vomiting, -diarrhea, -constipation, -blood in stool, -changes in bowel movement, -difficulty swallowing or  eating Hematology: -bleeding, -bruising  Musculoskeletal: -joint aches, -muscle aches, -joint swelling, +back pain, -neck pain, -cramping, -changes in gait Ophthalmology: denies vision changes, eye redness, itching, discharge Urology: -burning with urination, -difficulty urinating, -blood in urine, -urinary frequency, -urgency, -incontinence Neurology: -headache, -weakness, -tingling, -numbness, -memory loss, -falls, -dizziness Psychology: -depressed mood, -agitation, -sleep problems     Objective:   Physical Exam  BP 118/76 mmHg  Pulse 72  Temp(Src) 97.9 F (36.6 C) (Oral)  Resp 15  Ht 5' 1.5" (1.562 m)  Wt 164 lb (74.39 kg)  BMI 30.49 kg/m2  Wt Readings from Last 3 Encounters:  11/18/14 164 lb (74.39 kg)  10/06/13 169 lb (76.658 kg)  10/01/13 169 lb 8.5 oz (76.9 kg)    General appearance: alert, no distress, WD/WN, AA female Skin: several small brown lesions under both breasts, 1 under left breast with raised brown fleshy lesion, right upper back 4cm x 1cm flat scar, likely from prior scar/abrasion, no worrisome lesions HEENT: normocephalic, conjunctiva/corneas normal, sclerae anicteric, PERRLA, EOMi, nares patent, no discharge or erythema, pharynx normal Oral cavity: MMM, tongue normal, teeth - few areas of decay, otherwise in good repair Neck: supple, no lymphadenopathy, no thyromegaly, no masses, normal ROM, no bruits Chest: non tender, normal shape and expansion Heart: RRR, normal S1, S2, no murmurs Lungs: CTA bilaterally, no wheezes, rhonchi, or rales Abdomen: +bs, soft, non tender, non distended, no masses, no hepatomegaly, no splenomegaly, no bruits Back: mild tenderness of left lumbar paraspoinal region, othewrise non tender, normal ROM, no scoliosis Musculoskeletal: upper extremities non tender, no obvious deformity, normal ROM throughout, lower extremities non tender, no obvious deformity, normal ROM throughout Extremities: no edema, no cyanosis, no clubbing Pulses:  2+ symmetric, upper and lower extremities, normal cap refill Neurological: alert, oriented x 3, CN2-12 intact, strength normal upper extremities and lower extremities, sensation normal throughout, DTRs 2+ throughout, no cerebellar signs, gait normal Psychiatric: normal affect, behavior normal, pleasant  Breast/gyn/rectal - deferred to gyn   Assessment and Plan :    Encounter Diagnoses  Name Primary?  . Encounter for health maintenance examination in adult Yes  . Low back pain without sciatica, unspecified back pain laterality   . Skin lesions   . Need for prophylactic vaccination and inoculation against influenza     Physical exam - discussed healthy lifestyle, diet, exercise, preventative care, vaccinations, and addressed their concerns.  Handout given. Routine labs today.   She will bring back a biometric form for completion for insurance. See your eye doctor yearly for routine vision care. See your dentist yearly for routine dental care including hygiene visits twice yearly. See your gynecologist yearly for routine gynecological care. Counseled on the influenza virus vaccine.  Vaccine information sheet given.  Influenza vaccine given after consent obtained. Back pain - advised daily stretching, consider masage therapy, Robaxin prn, NSAID OTC prn Follow-up pending labs

## 2014-11-18 NOTE — Patient Instructions (Signed)
Thank you for giving me the opportunity to serve you today.   Your diagnoses today includes: Encounter Diagnoses  Name Primary?  . Encounter for health maintenance examination in adult Yes  . Low back pain without sciatica, unspecified back pain laterality   . Skin lesions   . Need for prophylactic vaccination and inoculation against influenza     Problems identified today: Back pain   Specific recommendations today include Exercise most days per week, preferably at least 30 minutes every day See your eye doctor yearly for routine vision care. See your dentist yearly for routine dental care including hygiene visits twice yearly. See your gynecologist yearly for routine gynecological care. Please get me a copy of your last tetanus vaccine date For back pain, use daily stretching routine.   You can use OTC Aleve as needed, and I refilled muscle relaxer Robaxin for as needed use at bedtime Consider massage therapy We updated your flu vaccine today   Please follow up pending labs  I have included other useful information below for your review.  Massage Therapy:  Sharen Hint [facility] [address] [place], [place] [address] [phone] [email or web address]   [facility] [address] [place], [place] [address] [phone]  Preventative Care for Adults - Female      MAINTAIN REGULAR HEALTH EXAMS:  A routine yearly physical is a good way to check in with your primary care provider about your health and preventive screening. It is also an opportunity to share updates about your health and any concerns you have, and receive a thorough all-over exam.   Most health insurance companies pay for at least some preventative services.  Check with your health plan for specific coverages.  WHAT PREVENTATIVE SERVICES DO WOMEN NEED?  Adult women should have their weight and blood pressure checked regularly.    Women age 53 and older should have their cholesterol levels checked regularly.  Women should be screened for cervical cancer with a Pap smear and pelvic exam beginning at either age 75, or 3 years after they become sexually activity.    Breast cancer screening generally begins at age 45 with a mammogram and breast exam by your primary care provider.    Beginning at age 65 and continuing to age 48, women should be screened for colorectal cancer.  Certain people may need continued testing until age 84.  Updating vaccinations is part of preventative care.  Vaccinations help protect against diseases such as the flu.  Osteoporosis is a disease in which the bones lose minerals and strength as we age. Women ages 35 and over should discuss this with their caregivers, as should women after menopause who have other risk factors.  Lab tests are generally done as part of preventative care to screen for anemia and blood disorders, to screen for problems with the kidneys and liver, to screen for bladder problems, to check blood sugar, and to check your cholesterol level.  Preventative services generally include counseling about diet, exercise, avoiding tobacco, drugs, excessive alcohol consumption, and sexually transmitted infections.    GENERAL RECOMMENDATIONS FOR GOOD HEALTH:  Healthy diet:  Eat a variety of foods, including fruit, vegetables, animal or vegetable protein, such as meat, fish, chicken, and eggs, or beans, lentils, tofu, and grains, such as rice.  Drink plenty of water daily.  Decrease saturated fat in the diet, avoid lots of red meat, processed foods, sweets, fast foods, and fried foods.  Exercise:  Aerobic  exercise helps maintain good heart health. At least 30-40 minutes of moderate-intensity exercise is recommended. For example, a brisk walk that increases your heart rate and breathing. This should be done on most days of the week.   Find a type of exercise or a variety of  exercises that you enjoy so that it becomes a part of your daily life.  Examples are running, walking, swimming, water aerobics, and biking.  For motivation and support, explore group exercise such as aerobic class, spin class, Zumba, Yoga,or  martial arts, etc.    Set exercise goals for yourself, such as a certain weight goal, walk or run in a race such as a 5k walk/run.  Speak to your primary care provider about exercise goals.  Disease prevention:  If you smoke or chew tobacco, find out from your caregiver how to quit. It can literally save your life, no matter how long you have been a tobacco user. If you do not use tobacco, never begin.   Maintain a healthy diet and normal weight. Increased weight leads to problems with blood pressure and diabetes.   The Body Mass Index or BMI is a way of measuring how much of your body is fat. Having a BMI above 27 increases the risk of heart disease, diabetes, hypertension, stroke and other problems related to obesity. Your caregiver can help determine your BMI and based on it develop an exercise and dietary program to help you achieve or maintain this important measurement at a healthful level.  High blood pressure causes heart and blood vessel problems.  Persistent high blood pressure should be treated with medicine if weight loss and exercise do not work.   Fat and cholesterol leaves deposits in your arteries that can block them. This causes heart disease and vessel disease elsewhere in your body.  If your cholesterol is found to be high, or if you have heart disease or certain other medical conditions, then you may need to have your cholesterol monitored frequently and be treated with medication.   Ask if you should have a cardiac stress test if your history suggests this. A stress test is a test done on a treadmill that looks for heart disease. This test can find disease prior to there being a problem.  Menopause can be associated with physical symptoms  and risks. Hormone replacement therapy is available to decrease these. You should talk to your caregiver about whether starting or continuing to take hormones is right for you.   Osteoporosis is a disease in which the bones lose minerals and strength as we age. This can result in serious bone fractures. Risk of osteoporosis can be identified using a bone density scan. Women ages 64 and over should discuss this with their caregivers, as should women after menopause who have other risk factors. Ask your caregiver whether you should be taking a calcium supplement and Vitamin D, to reduce the rate of osteoporosis.   Avoid drinking alcohol in excess (more than two drinks per day).  Avoid use of street drugs. Do not share needles with anyone. Ask for professional help if you need assistance or instructions on stopping the use of alcohol, cigarettes, and/or drugs.  Brush your teeth twice a day with fluoride toothpaste, and floss once a day. Good oral hygiene prevents tooth decay and gum disease. The problems can be painful, unattractive, and can cause other health problems. Visit your dentist for a routine oral and dental check up and preventive care every 6-12 months.  Look at your skin regularly.  Use a mirror to look at your back. Notify your caregivers of changes in moles, especially if there are changes in shapes, colors, a size larger than a pencil eraser, an irregular border, or development of new moles.  Safety:  Use seatbelts 100% of the time, whether driving or as a passenger.  Use safety devices such as hearing protection if you work in environments with loud noise or significant background noise.  Use safety glasses when doing any work that could send debris in to the eyes.  Use a helmet if you ride a bike or motorcycle.  Use appropriate safety gear for contact sports.  Talk to your caregiver about gun safety.  Use sunscreen with a SPF (or skin protection factor) of 15 or greater.  Lighter  skinned people are at a greater risk of skin cancer. Don't forget to also wear sunglasses in order to protect your eyes from too much damaging sunlight. Damaging sunlight can accelerate cataract formation.   Practice safe sex. Use condoms. Condoms are used for birth control and to help reduce the spread of sexually transmitted infections (or STIs).  Some of the STIs are gonorrhea (the clap), chlamydia, syphilis, trichomonas, herpes, HPV (human papilloma virus) and HIV (human immunodeficiency virus) which causes AIDS. The herpes, HIV and HPV are viral illnesses that have no cure. These can result in disability, cancer and death.   Keep carbon monoxide and smoke detectors in your home functioning at all times. Change the batteries every 6 months or use a model that plugs into the wall.   Vaccinations:  Stay up to date with your tetanus shots and other required immunizations. You should have a booster for tetanus every 10 years. Be sure to get your flu shot every year, since 5%-20% of the [place] population comes down with the flu. The flu vaccine changes each year, so being vaccinated once is not enough. Get your shot in the fall, before the flu season peaks.   Other vaccines to consider:  Human Papilloma Virus or HPV causes cancer of the cervix, and other infections that can be transmitted from person to person. There is a vaccine for HPV, and females should get immunized between the ages of 4 and 72. It requires a series of 3 shots.   Pneumococcal vaccine to protect against certain types of pneumonia.  This is normally recommended for adults age 57 or older.  However, adults younger than 48 years old with certain underlying conditions such as diabetes, heart or lung disease should also receive the vaccine.  Shingles vaccine to protect against Varicella Zoster if you are older than age 36, or younger than 48 years old with certain underlying illness.  Hepatitis A vaccine to protect against a form of  infection of the liver by a virus acquired from food.  Hepatitis B vaccine to protect against a form of infection of the liver by a virus acquired from blood or body fluids, particularly if you work in health care.  If you plan to travel internationally, check with your local health department for specific vaccination recommendations.  Cancer Screening:  Breast cancer screening is essential to preventive care for women. All women age 11 and older should perform a breast self-exam every month. At age 59 and older, women should have their caregiver complete a breast exam each year. Women at ages 36 and older should have a mammogram (x-ray film) of the breasts. Your caregiver can discuss how often you need  mammograms.    Cervical cancer screening includes taking a Pap smear (sample of cells examined under a microscope) from the cervix (end of the uterus). It also includes testing for HPV (Human Papilloma Virus, which can cause cervical cancer). Screening and a pelvic exam should begin at age 33, or 3 years after a woman becomes sexually active. Screening should occur every year, with a Pap smear but no HPV testing, up to age 29. After age 46, you should have a Pap smear every 3 years with HPV testing, if no HPV was found previously.   Most routine colon cancer screening begins at the age of 21. On a yearly basis, doctors may provide special easy to use take-home tests to check for hidden blood in the stool. Sigmoidoscopy or colonoscopy can detect the earliest forms of colon cancer and is life saving. These tests use a small camera at the end of a tube to directly examine the colon. Speak to your caregiver about this at age 51, when routine screening begins (and is repeated every 5 years unless early forms of pre-cancerous polyps or small growths are found).

## 2014-11-29 NOTE — Addendum Note (Signed)
Addended by: Herminio Commons A on: 11/29/2014 11:48 AM   Modules accepted: Orders

## 2015-07-13 DIAGNOSIS — M25512 Pain in left shoulder: Secondary | ICD-10-CM | POA: Diagnosis not present

## 2015-07-19 ENCOUNTER — Other Ambulatory Visit: Payer: Self-pay | Admitting: Orthopedic Surgery

## 2015-07-19 DIAGNOSIS — M25512 Pain in left shoulder: Secondary | ICD-10-CM

## 2015-07-21 ENCOUNTER — Other Ambulatory Visit: Payer: Self-pay | Admitting: Orthopedic Surgery

## 2015-07-21 DIAGNOSIS — M542 Cervicalgia: Secondary | ICD-10-CM

## 2015-07-21 DIAGNOSIS — M25512 Pain in left shoulder: Secondary | ICD-10-CM

## 2015-07-26 DIAGNOSIS — M25522 Pain in left elbow: Secondary | ICD-10-CM | POA: Diagnosis not present

## 2015-07-26 DIAGNOSIS — M79642 Pain in left hand: Secondary | ICD-10-CM | POA: Diagnosis not present

## 2015-07-26 DIAGNOSIS — R5381 Other malaise: Secondary | ICD-10-CM | POA: Diagnosis not present

## 2015-07-26 DIAGNOSIS — Z79899 Other long term (current) drug therapy: Secondary | ICD-10-CM | POA: Diagnosis not present

## 2015-07-26 DIAGNOSIS — R300 Dysuria: Secondary | ICD-10-CM | POA: Diagnosis not present

## 2015-07-26 DIAGNOSIS — M2550 Pain in unspecified joint: Secondary | ICD-10-CM | POA: Diagnosis not present

## 2015-07-26 DIAGNOSIS — M25511 Pain in right shoulder: Secondary | ICD-10-CM | POA: Diagnosis not present

## 2015-07-29 ENCOUNTER — Other Ambulatory Visit: Payer: Managed Care, Other (non HMO)

## 2015-07-29 DIAGNOSIS — M50222 Other cervical disc displacement at C5-C6 level: Secondary | ICD-10-CM | POA: Diagnosis not present

## 2015-07-29 DIAGNOSIS — M67912 Unspecified disorder of synovium and tendon, left shoulder: Secondary | ICD-10-CM | POA: Diagnosis not present

## 2015-07-29 DIAGNOSIS — M25512 Pain in left shoulder: Secondary | ICD-10-CM | POA: Diagnosis not present

## 2015-07-29 DIAGNOSIS — M19012 Primary osteoarthritis, left shoulder: Secondary | ICD-10-CM | POA: Diagnosis not present

## 2015-07-29 DIAGNOSIS — M47812 Spondylosis without myelopathy or radiculopathy, cervical region: Secondary | ICD-10-CM | POA: Diagnosis not present

## 2015-07-30 ENCOUNTER — Inpatient Hospital Stay: Admission: RE | Admit: 2015-07-30 | Payer: Managed Care, Other (non HMO) | Source: Ambulatory Visit

## 2015-08-01 DIAGNOSIS — M25511 Pain in right shoulder: Secondary | ICD-10-CM | POA: Diagnosis not present

## 2015-08-01 DIAGNOSIS — M542 Cervicalgia: Secondary | ICD-10-CM | POA: Diagnosis not present

## 2015-08-04 DIAGNOSIS — M542 Cervicalgia: Secondary | ICD-10-CM | POA: Diagnosis not present

## 2015-08-04 DIAGNOSIS — M25511 Pain in right shoulder: Secondary | ICD-10-CM | POA: Diagnosis not present

## 2015-08-05 DIAGNOSIS — R202 Paresthesia of skin: Secondary | ICD-10-CM | POA: Diagnosis not present

## 2015-08-12 ENCOUNTER — Other Ambulatory Visit: Payer: Managed Care, Other (non HMO)

## 2015-12-08 ENCOUNTER — Encounter: Payer: BLUE CROSS/BLUE SHIELD | Admitting: Medical

## 2015-12-30 ENCOUNTER — Ambulatory Visit (INDEPENDENT_AMBULATORY_CARE_PROVIDER_SITE_OTHER): Payer: BLUE CROSS/BLUE SHIELD | Admitting: Medical

## 2015-12-30 ENCOUNTER — Encounter: Payer: Self-pay | Admitting: Medical

## 2015-12-30 VITALS — BP 120/80 | HR 81 | Ht 61.25 in | Wt 164.0 lb

## 2015-12-30 DIAGNOSIS — Z0000 Encounter for general adult medical examination without abnormal findings: Secondary | ICD-10-CM | POA: Diagnosis not present

## 2015-12-30 DIAGNOSIS — K219 Gastro-esophageal reflux disease without esophagitis: Secondary | ICD-10-CM | POA: Insufficient documentation

## 2015-12-30 DIAGNOSIS — Z23 Encounter for immunization: Secondary | ICD-10-CM | POA: Insufficient documentation

## 2015-12-30 DIAGNOSIS — Z13820 Encounter for screening for osteoporosis: Secondary | ICD-10-CM | POA: Insufficient documentation

## 2015-12-30 DIAGNOSIS — G8929 Other chronic pain: Secondary | ICD-10-CM | POA: Diagnosis not present

## 2015-12-30 DIAGNOSIS — M545 Low back pain, unspecified: Secondary | ICD-10-CM | POA: Insufficient documentation

## 2015-12-30 DIAGNOSIS — E669 Obesity, unspecified: Secondary | ICD-10-CM | POA: Insufficient documentation

## 2015-12-30 DIAGNOSIS — J309 Allergic rhinitis, unspecified: Secondary | ICD-10-CM | POA: Diagnosis not present

## 2015-12-30 LAB — POCT URINALYSIS DIP (MANUAL ENTRY)
BILIRUBIN UA: NEGATIVE
Glucose, UA: NEGATIVE
Ketones, POC UA: NEGATIVE
LEUKOCYTES UA: NEGATIVE
NITRITE UA: NEGATIVE
PH UA: 6
PROTEIN UA: NEGATIVE
Spec Grav, UA: >=1.03
Urobilinogen, UA: NEGATIVE

## 2015-12-30 LAB — CBC
HEMATOCRIT: 34.1 % — AB (ref 35.0–45.0)
Hemoglobin: 11.1 g/dL — ABNORMAL LOW (ref 11.7–15.5)
MCH: 28.4 pg (ref 27.0–33.0)
MCHC: 32.6 g/dL (ref 32.0–36.0)
MCV: 87.2 fL (ref 80.0–100.0)
MPV: 8.8 fL (ref 7.5–12.5)
Platelets: 327 10*3/uL (ref 140–400)
RBC: 3.91 MIL/uL (ref 3.80–5.10)
RDW: 13.7 % (ref 11.0–15.0)
WBC: 5.6 10*3/uL (ref 4.0–10.5)

## 2015-12-30 LAB — COMPREHENSIVE METABOLIC PANEL
ALT: 13 U/L (ref 6–29)
AST: 18 U/L (ref 10–35)
Albumin: 4.1 g/dL (ref 3.6–5.1)
Alkaline Phosphatase: 54 U/L (ref 33–115)
BUN: 16 mg/dL (ref 7–25)
CHLORIDE: 105 mmol/L (ref 98–110)
CO2: 27 mmol/L (ref 20–31)
CREATININE: 0.96 mg/dL (ref 0.50–1.10)
Calcium: 9.6 mg/dL (ref 8.6–10.2)
Glucose, Bld: 79 mg/dL (ref 65–99)
Potassium: 3.7 mmol/L (ref 3.5–5.3)
SODIUM: 141 mmol/L (ref 135–146)
Total Bilirubin: 0.2 mg/dL (ref 0.2–1.2)
Total Protein: 7.3 g/dL (ref 6.1–8.1)

## 2015-12-30 NOTE — Progress Notes (Signed)
Subjective:   HPI  [name] is a 50 y.o. female who presents for a complete physical.  Medical care team includes: [facility] Aziyah Provencal [place], PA-C here for primary care   Concerns: She continues to report low back pain, ongoing.  She mentioned this last year, but it has persisted.  She notes sometimes getting tingling in legs, but no swelling, no incontinence, no fever, no blood in urine or stool.  Reviewed their medical, surgical, family, social, medication, and allergy history and updated chart as appropriate.  Past Medical History:  Diagnosis Date  . Allergy   . Bursitis of shoulder, right   . GERD (gastroesophageal reflux disease)   . Heart murmur    as a child  . Knee pain, bilateral    intermittent pain, ran track in high school; OA  . Obesity   . Routine gynecological examination    Dr. Henderson Cloud  . Wisdom teeth extracted     Past Surgical History:  Procedure Laterality Date  . KNEE ARTHROSCOPY WITH DRILLING/MICROFRACTURE Left 10/06/2013   Procedure: KNEE ARTHROSCOPY WITH DRILLING/MICROFRACTURE AND ALLOGRAFT CARTLAGE IMPLANTATION, CHONDRAPLASTY OF TROCHIAL AND PATELLA;  Surgeon: Cammy Copa, MD;  Location: [facility] OR;  Service: Orthopedics;  Laterality: Left;  . KNEE SURGERY  1989   tendonitis, right knee;   . TUBAL LIGATION      Social History   Social History  . Marital status: Married    Spouse name: N/A  . Number of children: N/A  . Years of education: N/A   Occupational History  . Not on file.   Social History Main Topics  . Smoking status: Never Smoker  . Smokeless tobacco: Never Used  . Alcohol use No  . Drug use: No  . Sexual activity: Not on file   Other Topics Concern  . 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  Hobbies -  walking, makes jewelry, likes adult coloring books    Family History  Problem Relation Age of Onset  . Hypertension Mother   . Kidney disease Mother     hypertensive kidney failure, renal transplant  . Diabetes Mother   . Heart disease Mother 76    CHF  . Hypertension Father   . Sleep apnea Father   . Cancer Father     leukemia  . Fibroids Sister   . Stroke Neg Hx      Current Outpatient Prescriptions:  Marland Kitchen  Multiple Vitamins-Minerals (MULTI-VITAMIN GUMMIES PO), Take by mouth., Disp: , Rfl:   No Known Allergies   Review of Systems Constitutional: -fever, -chills, -sweats, -unexpected weight change, -decreased appetite, +fatigue Allergy: -sneezing, -itching, -congestion Dermatology: +changing moles, --rash, -lumps ENT: -runny nose, -ear pain, -sore throat, -hoarseness, -sinus pain, -teeth pain, - ringing in ears, -hearing loss, -nosebleeds Cardiology: -chest pain, -palpitations, -swelling, -difficulty breathing when lying flat, -waking up short of breath Respiratory: -cough, -shortness of breath, -difficulty breathing with exercise or exertion, -wheezing, -coughing up blood Gastroenterology: -abdominal pain, -nausea, -vomiting, -diarrhea, -constipation, -blood in stool, -changes in bowel movement, -difficulty swallowing or eating Hematology: -bleeding, -bruising  Musculoskeletal: -joint aches, -muscle aches, -joint swelling, +back pain, -neck pain, -cramping, -changes in gait Ophthalmology: denies vision changes, eye redness, itching, discharge Urology: -burning with urination, -difficulty urinating, -blood in urine, -urinary frequency, -urgency, -incontinence Neurology: -headache, -weakness, +tingling, +  numbness, -memory loss, -falls, -dizziness Psychology: -depressed mood, -agitation, -sleep problems     Objective:   Physical Exam  BP 120/80 (BP Location: Right Arm, Patient Position: Sitting, Cuff Size: Normal)   Pulse 81   Ht 5' 1.25" (1.556 m)   Wt 164 lb (74.4 kg)    SpO2 99%   BMI 30.74 kg/m   Wt Readings from Last 3 Encounters:  12/30/15 164 lb (74.4 kg)  11/18/14 164 lb (74.4 kg)  10/06/13 169 lb (76.7 kg)    General appearance: alert, no distress, WD/WN, AA female Skin:  no worrisome lesions HEENT: normocephalic, conjunctiva/corneas normal, sclerae anicteric, PERRLA, EOMi, nares patent, no discharge or erythema, pharynx normal Oral cavity: MMM, tongue normal, teeth - few areas of decay, otherwise in good repair Neck: supple, no lymphadenopathy, no thyromegaly, no masses, normal ROM, no bruits Chest: non tender, normal shape and expansion Heart: RRR, normal S1, S2, no murmurs Lungs: CTA bilaterally, no wheezes, rhonchi, or rales Abdomen: +bs, soft, non tender, non distended, no masses, no hepatomegaly, no splenomegaly, no bruits Back: mild tenderness of left lumbar paraspinal region, otherwise non tender, normal ROM, no scoliosis Musculoskeletal: upper extremities non tender, no obvious deformity, normal ROM throughout, lower extremities non tender, no obvious deformity, normal ROM throughout Extremities: no edema, no cyanosis, no clubbing Pulses: 2+ symmetric, upper and lower extremities, normal cap refill Neurological: alert, oriented x 3, CN2-12 intact, strength normal upper extremities and lower extremities, sensation normal throughout, DTRs 2+ throughout, no cerebellar signs, gait normal Psychiatric: normal affect, behavior normal, pleasant  Breast/gyn/rectal - deferred to gyn   Assessment and Plan :    Encounter Diagnoses  Name Primary?  . Routine physical examination Yes  . Chronic low back pain   . Obesity   . Need for prophylactic vaccination and inoculation against influenza   . Encounter for immunization   . Allergic rhinitis, unspecified allergic rhinitis type   . Gastroesophageal reflux disease without esophagitis     Physical exam - discussed healthy lifestyle, diet, exercise, preventative care, vaccinations, and  addressed their concerns.   Routine labs today.    See your eye doctor yearly for routine vision care. See your dentist yearly for routine dental care including hygiene visits twice yearly. See your gynecologist yearly for routine gynecological care. Counseled on the influenza virus vaccine.  Vaccine information sheet given.  Influenza vaccine given after consent obtained. Back pain - will send for xray Follow-up pending labs  Gabryella was seen today for annual exam.  Diagnoses and all orders for this visit:  Routine physical examination -     Cancel: POCT urinalysis dipstick -     POCT urinalysis dipstick -     Hemoglobin A1c -     Comprehensive metabolic panel -     CBC  Chronic low back pain -     DG Lumbar Spine Complete; Future  Obesity  Need for prophylactic vaccination and inoculation against influenza  Encounter for immunization -     Flu Vaccine QUAD 36+ mos IM  Allergic rhinitis, unspecified allergic rhinitis type  Gastroesophageal reflux disease without esophagitis

## 2015-12-31 LAB — HEMOGLOBIN A1C
Hgb A1c MFr Bld: 5.2 % (ref <5.7)
MEAN PLASMA GLUCOSE: 103 mg/dL

## 2016-01-02 ENCOUNTER — Other Ambulatory Visit (INDEPENDENT_AMBULATORY_CARE_PROVIDER_SITE_OTHER): Payer: BLUE CROSS/BLUE SHIELD

## 2016-01-02 ENCOUNTER — Ambulatory Visit
Admission: RE | Admit: 2016-01-02 | Discharge: 2016-01-02 | Disposition: A | Discharge status: Home / Self Care | Payer: BLUE CROSS/BLUE SHIELD | Source: Ambulatory Visit | Attending: Medical | Admitting: Medical

## 2016-01-02 DIAGNOSIS — G8929 Other chronic pain: Secondary | ICD-10-CM

## 2016-01-02 DIAGNOSIS — Z111 Encounter for screening for respiratory tuberculosis: Secondary | ICD-10-CM

## 2016-01-02 DIAGNOSIS — M545 Low back pain, unspecified: Secondary | ICD-10-CM

## 2016-01-02 DIAGNOSIS — M47816 Spondylosis without myelopathy or radiculopathy, lumbar region: Secondary | ICD-10-CM | POA: Diagnosis not present

## 2016-01-03 ENCOUNTER — Telehealth: Payer: Self-pay

## 2016-01-03 NOTE — Telephone Encounter (Signed)
Pt called back and gave results to her.  She is agreeable to physical therapy.  Please set her up

## 2016-01-03 NOTE — Telephone Encounter (Signed)
Called patient to give results. No answer. Left vmail.

## 2016-01-03 NOTE — Telephone Encounter (Signed)
-----   Message from Jac Canavanavid S Tysinger, PA-C sent at 01/02/2016  8:45 PM EDT ----- Back xray shows some disc space flattening, arthritis changes.   I recommend routine aerobic exercise, regular if not daily stretching and calisthenics, and back strengthening exercises such as crunches, rows, core twists.   If agreeable we can refer to physical therapy to get her doing some specific back exercises.

## 2016-01-04 LAB — TB SKIN TEST: TB Skin Test: NEGATIVE

## 2016-01-04 NOTE — Telephone Encounter (Signed)
I have faxed over to [facility]

## 2016-01-04 NOTE — Telephone Encounter (Signed)
Refer to PT

## 2016-01-11 DIAGNOSIS — Z6831 Body mass index (BMI) 31.0-31.9, adult: Secondary | ICD-10-CM | POA: Diagnosis not present

## 2016-01-11 DIAGNOSIS — Z01419 Encounter for gynecological examination (general) (routine) without abnormal findings: Secondary | ICD-10-CM | POA: Diagnosis not present

## 2016-02-10 DIAGNOSIS — Z1231 Encounter for screening mammogram for malignant neoplasm of breast: Secondary | ICD-10-CM | POA: Diagnosis not present

## 2016-02-10 DIAGNOSIS — Z6831 Body mass index (BMI) 31.0-31.9, adult: Secondary | ICD-10-CM | POA: Diagnosis not present

## 2016-02-15 ENCOUNTER — Ambulatory Visit (INDEPENDENT_AMBULATORY_CARE_PROVIDER_SITE_OTHER): Payer: BLUE CROSS/BLUE SHIELD | Admitting: Orthopedic Surgery

## 2016-02-20 ENCOUNTER — Telehealth: Payer: Self-pay | Admitting: Medical

## 2016-02-20 NOTE — Telephone Encounter (Signed)
Pt came in and dropped off a health exam  form to be completed. Pt did have a cpe in September. Please call pt at [phone] when ready.

## 2016-02-21 ENCOUNTER — Telehealth: Payer: Self-pay | Admitting: Medical

## 2016-02-22 NOTE — Telephone Encounter (Signed)
I'm confused?  I don't have her shot records.   Did you mean she is going to bring us shot records?  Please clarify

## 2016-02-22 NOTE — Telephone Encounter (Signed)
The school form requires vaccine proof for Hep B, Tdap and MMR as well as TB test.     (Pull old chart to see if we have vaccine records there.)  Call and ask when was last Tdap vaccine? Does she have copy of vaccines from prior job/school/doctor?  If no records, and if no copy of Tdap, we can given tdap and do lab titers for the oterh vaccines She can come in for TB skin test at her convenience, as long as she can return within 48-72 hours later for reading

## 2016-02-22 NOTE — Telephone Encounter (Signed)
Called patient  To let her know that her shot records will be upfront for her

## 2016-02-23 ENCOUNTER — Other Ambulatory Visit: Payer: BLUE CROSS/BLUE SHIELD

## 2016-02-23 ENCOUNTER — Other Ambulatory Visit: Payer: Self-pay | Admitting: Medical

## 2016-02-23 ENCOUNTER — Telehealth: Payer: Self-pay | Admitting: Medical

## 2016-02-23 DIAGNOSIS — Z139 Encounter for screening, unspecified: Secondary | ICD-10-CM

## 2016-02-23 NOTE — Telephone Encounter (Signed)
Patient came in for labs. 

## 2016-02-23 NOTE — Telephone Encounter (Signed)
She is up to date on Td. Please abstract the vaccine.    Have her come in for MMR and Hep B titer labs as I have no proof of immunity.    Lab orders are in system.   The other option would be to get a Hep B booster and MMR booster.    Her choice, but typically we check labs to see if immune.   I have the school form pending this above.

## 2016-02-23 NOTE — Telephone Encounter (Signed)
Pt aware needs Hep B and MMR titers drawn. Labs drawn on 02/23/2016 [name]

## 2016-02-23 NOTE — Telephone Encounter (Signed)
Called l/m  For patient this

## 2016-02-24 LAB — MEASLES/MUMPS/RUBELLA IMMUNITY
Mumps IgG: 62.2 AU/mL — ABNORMAL HIGH (ref <9.00)
Rubella: 5.45 Index — ABNORMAL HIGH (ref <0.90)

## 2016-02-24 LAB — HEPATITIS B SURFACE ANTIBODY, QUANTITATIVE

## 2016-02-29 ENCOUNTER — Other Ambulatory Visit (INDEPENDENT_AMBULATORY_CARE_PROVIDER_SITE_OTHER): Payer: BLUE CROSS/BLUE SHIELD

## 2016-02-29 DIAGNOSIS — Z23 Encounter for immunization: Secondary | ICD-10-CM | POA: Diagnosis not present

## 2016-02-29 DIAGNOSIS — Z789 Other specified health status: Secondary | ICD-10-CM

## 2016-03-30 ENCOUNTER — Other Ambulatory Visit (INDEPENDENT_AMBULATORY_CARE_PROVIDER_SITE_OTHER): Payer: BLUE CROSS/BLUE SHIELD

## 2016-03-30 DIAGNOSIS — Z23 Encounter for immunization: Secondary | ICD-10-CM | POA: Diagnosis not present

## 2016-04-17 ENCOUNTER — Ambulatory Visit (INDEPENDENT_AMBULATORY_CARE_PROVIDER_SITE_OTHER): Payer: BLUE CROSS/BLUE SHIELD

## 2016-04-17 ENCOUNTER — Encounter: Payer: Self-pay | Admitting: Sports Medicine

## 2016-04-17 ENCOUNTER — Ambulatory Visit (INDEPENDENT_AMBULATORY_CARE_PROVIDER_SITE_OTHER): Payer: BLUE CROSS/BLUE SHIELD | Admitting: Sports Medicine

## 2016-04-17 DIAGNOSIS — M79672 Pain in left foot: Secondary | ICD-10-CM

## 2016-04-17 DIAGNOSIS — M79671 Pain in right foot: Secondary | ICD-10-CM

## 2016-04-17 DIAGNOSIS — M79673 Pain in unspecified foot: Secondary | ICD-10-CM

## 2016-04-17 DIAGNOSIS — M722 Plantar fascial fibromatosis: Secondary | ICD-10-CM

## 2016-04-17 MED ORDER — TRIAMCINOLONE ACETONIDE 10 MG/ML IJ SUSP: 10.0000 mg | Freq: Once | INTRAMUSCULAR | Status: DC

## 2016-04-17 MED ORDER — DICLOFENAC SODIUM 75 MG PO TBEC: 75.0000 mg | DELAYED_RELEASE_TABLET | Freq: Two times a day (BID) | ORAL | 0 refills | Status: DC

## 2016-04-17 MED ORDER — METHYLPREDNISOLONE 4 MG PO TBPK: ORAL_TABLET | ORAL | 0 refills | Status: DC

## 2016-04-17 NOTE — Patient Instructions (Signed)

## 2016-04-17 NOTE — Progress Notes (Signed)
Subjective: [name] is a 51 y.o. female patient presents to office with complaint of heel pain on the left and right. Patient admits to post static dyskinesia for 2 months in duration. Patient has treated this problem with OTC inserts, soaking, and change in shoes with no relief, Shooting pain. Denies any other pedal complaints.   Patient Active Problem List   Diagnosis Date Noted  . Gastroesophageal reflux disease without esophagitis 12/30/2015  . Rhinitis, allergic 12/30/2015  . Encounter for immunization 12/30/2015  . Need for prophylactic vaccination and inoculation against influenza 12/30/2015  . Obesity 12/30/2015  . Chronic low back pain 12/30/2015  . Routine physical examination 12/30/2015  . 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 Multiple Vitamins-Minerals (MULTI-VITAMIN GUMMIES PO) Take by mouth.     No current facility-administered medications on file prior to visit.     No Known Allergies  Objective: Physical Exam General: The patient is alert and oriented x3 in no acute distress.  Dermatology: Skin is warm, dry and supple bilateral lower extremities. Nails 1-10 are normal. There is no erythema, edema, no eccymosis, no open lesions present. Integument is otherwise unremarkable.  Vascular: Dorsalis Pedis pulse and Posterior Tibial pulse are 2/4 bilateral. Capillary fill time is immediate to all digits.  Neurological: Grossly intact to light touch with an achilles reflex of +2/5 and a negative Tinel's sign bilateral.  Musculoskeletal: Tenderness to palpation at the medial calcaneal tubercale and through the insertion of the plantar fascia on the left and right foot. No pain with compression of calcaneus bilateral. No pain with tuning fork to calcaneus bilateral. No pain with calf compression bilateral. There is decreased Ankle joint range of motion  bilateral. All other joints range of motion within normal limits bilateral. Pes planus bilateral. Strength 5/5 in all groups bilateral.   Gait: Unassisted, Antalgic avoid weight on left/right heel  Xray, Right/Left foot:  Normal osseous mineralization. Joint spaces preserved. Midtarsal breech suggestive of pes planus. No fracture/dislocation/boney destruction. Calcaneal spur present with mild thickening of plantar fascia. No other soft tissue abnormalities or radiopaque foreign bodies.   Assessment and Plan: Problem List Items Addressed This Visit    None    Visit Diagnoses    Heel pain, bilateral    -  Primary   Relevant Medications   methylPREDNISolone (MEDROL DOSEPAK) 4 MG TBPK tablet   diclofenac (VOLTAREN) 75 MG EC tablet   triamcinolone acetonide (KENALOG) 10 MG/ML injection 10 mg   Other Relevant Orders   DG Foot 2 Views Right   DG Foot 2 Views Left   Plantar fasciitis, bilateral       Relevant Medications   methylPREDNISolone (MEDROL DOSEPAK) 4 MG TBPK tablet   diclofenac (VOLTAREN) 75 MG EC tablet   triamcinolone acetonide (KENALOG) 10 MG/ML injection 10 mg      -Complete examination performed.  -Xrays reviewed -Discussed with patient in detail the condition of plantar fasciitis, how this occurs and general treatment options. Explained both conservative and surgical treatments.  -After oral consent and aseptic prep, injected a mixture containing 1 ml of 2% plain lidocaine, 1 ml 0.5% plain marcaine, 0.5 ml of kenalog 10 and 0.5 ml of dexamethasone phosphate into left and right heel. Post-injection care discussed with patient.  -Rx Diclofenac to start after Medrol dose pack is completed -Recommended good supportive shoes and advised continued use of OTC insert. Explained  to patient that if these orthoses work well, we will continue with these. If these do not improve her condition and  pain, we will consider custom molded orthoses. - Explained in detail the use of the fascial  brace bilateral which was dispensed at today's visit. -Explained and dispensed to patient daily stretching exercises. -Recommend patient to ice affected area 1-2x daily. -Patient to return to office in 4 weeks for follow up or sooner if problems or questions arise.  [name], DPM

## 2016-04-30 ENCOUNTER — Ambulatory Visit (INDEPENDENT_AMBULATORY_CARE_PROVIDER_SITE_OTHER): Payer: BLUE CROSS/BLUE SHIELD | Admitting: Orthopedic Surgery

## 2016-05-07 ENCOUNTER — Other Ambulatory Visit: Payer: Self-pay | Admitting: Sports Medicine

## 2016-05-07 DIAGNOSIS — M79672 Pain in left foot: Principal | ICD-10-CM

## 2016-05-07 DIAGNOSIS — M722 Plantar fascial fibromatosis: Secondary | ICD-10-CM

## 2016-05-07 DIAGNOSIS — M79671 Pain in right foot: Secondary | ICD-10-CM

## 2016-05-07 NOTE — Telephone Encounter (Signed)
Pt needs a follow up appt prior to future refills. 

## 2016-05-10 ENCOUNTER — Ambulatory Visit (INDEPENDENT_AMBULATORY_CARE_PROVIDER_SITE_OTHER): Payer: BLUE CROSS/BLUE SHIELD | Admitting: Orthopedic Surgery

## 2016-05-10 ENCOUNTER — Encounter (INDEPENDENT_AMBULATORY_CARE_PROVIDER_SITE_OTHER): Payer: Self-pay | Admitting: Orthopedic Surgery

## 2016-05-10 DIAGNOSIS — M79672 Pain in left foot: Secondary | ICD-10-CM

## 2016-05-10 DIAGNOSIS — M79671 Pain in right foot: Secondary | ICD-10-CM | POA: Diagnosis not present

## 2016-05-10 DIAGNOSIS — M5442 Lumbago with sciatica, left side: Secondary | ICD-10-CM | POA: Diagnosis not present

## 2016-05-10 DIAGNOSIS — M722 Plantar fascial fibromatosis: Secondary | ICD-10-CM | POA: Diagnosis not present

## 2016-05-10 MED ORDER — DICLOFENAC SODIUM 75 MG PO TBEC: 75.0000 mg | DELAYED_RELEASE_TABLET | Freq: Two times a day (BID) | ORAL | 0 refills | Status: DC

## 2016-05-10 MED ORDER — PREDNISONE 5 MG (21) PO TBPK: ORAL_TABLET | ORAL | 0 refills | Status: DC

## 2016-05-10 MED ORDER — ACETAMINOPHEN-CODEINE #3 300-30 MG PO TABS: 1.0000 | ORAL_TABLET | Freq: Four times a day (QID) | ORAL | 0 refills | Status: DC | PRN

## 2016-05-10 MED ORDER — METHOCARBAMOL 500 MG PO TABS: 500.0000 mg | ORAL_TABLET | Freq: Three times a day (TID) | ORAL | 0 refills | Status: DC | PRN

## 2016-05-10 NOTE — Progress Notes (Signed)
Office Visit Note   Patient: [name]           Date of Birth: 01/29/1966           MRN: [id number] Visit Date: 05/10/2016 Requested by: Jac Canavanavid S Tysinger, PA-C [address] [place], [place] [id number] PCP: Ernst BreachYSINGER, DAVID SHANE, PA-C  Subjective: Chief Complaint  Patient presents with  . Lower Back - Pain    HPI [name] is a 51 year old patient with low back and left buttock pain for 8 months.  Started in the summer.  Had radiographs made and September which are reviewed which are fairly benign in terms of absence of spondylolisthesis or significant arthritis.  She's had a course of a Medrol Dosepak for her feet if that helps her back some degree.  She's tried creams patches and none of that has helped.  She feels like there is a knife and her left lower back which is laying down.  She describes significant muscle spasms.  She had physical therapy at the end of last year and it did not help.  She has continued to work but it's been difficult.  Every time she takes a step with left leg she feels a sharp stabbing pain in the lower back and buttock.             Review of Systems All systems reviewed are negative as they relate to the chief complaint within the history of present illness.  Patient denies  fevers or chills.    Assessment & Plan: Visit Diagnoses:  1. Acute back pain with sciatica, left     Plan: Impression is low back pain left buttock pain for the past 8 months.  This likely represents herniated disc with resultant foraminal stenosis on the left-hand side probably at L4-5 and L5-S1.  She is failed conservative management.  I want to write her prescription for Tylenol with codeine voltaren and  a muscle relaxer and get her set up for an MRI scan.  I'll see her back after that study and likely get her set up for epidural steroid injections unless the disc herniation is so large that only surgery will fix it.  Follow-Up Instructions: No Follow-up on file.    Orders:  No orders of the defined types were placed in this encounter.  No orders of the defined types were placed in this encounter.     Procedures: No procedures performed   Clinical Data: No additional findings.  Objective: Vital Signs: There were no vitals taken for this visit.  Physical Exam   Constitutional: Patient appears well-developed HEENT:  Head: Normocephalic Eyes:EOM are normal Neck: Normal range of motion Cardiovascular: Normal rate Pulmonary/chest: Effort normal Neurologic: Patient is alert Skin: Skin is warm Psychiatric: Patient has normal mood and affect    Ortho Exam orthopedic exam demonstrates positive nerve retention signs on the left negative on the right.  Slight weakness with dorsiflexion of the left 5 minus out of 5 compared to the right 5+ out of 5.  No definite paresthesias L1 S1 bilaterally.  Reflexes symmetric 0-1+ out of 4 bilateral patella and Achilles.  Feet are perfused and sensate.  No groin pain with internal/external rotation of either leg.  Patient does have pain with forward lateral bending and it's difficult for her to get up and is difficult for her to walk leading with the left foot.  Specialty Comments:  No specialty comments available.  Imaging: No results found.   PMFS History: Patient Active Problem  List   Diagnosis Date Noted  . Acute back pain with sciatica, left 05/10/2016  . Gastroesophageal reflux disease without esophagitis 12/30/2015  . Rhinitis, allergic 12/30/2015  . Encounter for immunization 12/30/2015  . Need for prophylactic vaccination and inoculation against influenza 12/30/2015  . Obesity 12/30/2015  . Chronic low back pain 12/30/2015  . Routine physical examination 12/30/2015  . 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 Allergy   . Bursitis of shoulder, right   . GERD (gastroesophageal reflux disease)   . Heart murmur    as a child  .  Knee pain, bilateral    intermittent pain, ran track in high school; OA  . Obesity   . Routine gynecological examination    Dr. Henderson Cloud  . Wisdom teeth extracted     Family History  Problem Relation Age of Onset  . Hypertension Mother   . Kidney disease Mother     hypertensive kidney failure, renal transplant  . Diabetes Mother   . Heart disease Mother 51    CHF  . Hypertension Father   . Sleep apnea Father   . Cancer Father     leukemia  . Fibroids Sister   . Stroke Neg Hx     Past Surgical History:  Procedure Laterality Date  . KNEE ARTHROSCOPY WITH DRILLING/MICROFRACTURE Left 10/06/2013   Procedure: KNEE ARTHROSCOPY WITH DRILLING/MICROFRACTURE AND ALLOGRAFT CARTLAGE IMPLANTATION, CHONDRAPLASTY OF TROCHIAL AND PATELLA;  Surgeon: Cammy Copa, MD;  Location: [facility] OR;  Service: Orthopedics;  Laterality: Left;  . KNEE SURGERY  1989   tendonitis, right knee;   . TUBAL LIGATION     Social History   Occupational History  . Not on file.   Social History Main Topics  . Smoking status: Never Smoker  . Smokeless tobacco: Never Used  . Alcohol use No  . Drug use: No  . Sexual activity: Not on file

## 2016-05-15 ENCOUNTER — Ambulatory Visit: Payer: BLUE CROSS/BLUE SHIELD | Admitting: Sports Medicine

## 2016-05-22 ENCOUNTER — Ambulatory Visit
Admission: RE | Admit: 2016-05-22 | Discharge: 2016-05-22 | Disposition: A | Discharge status: Home / Self Care | Payer: BLUE CROSS/BLUE SHIELD | Source: Ambulatory Visit | Attending: Orthopedic Surgery | Admitting: Orthopedic Surgery

## 2016-05-22 DIAGNOSIS — M5442 Lumbago with sciatica, left side: Secondary | ICD-10-CM

## 2016-05-22 DIAGNOSIS — M5126 Other intervertebral disc displacement, lumbar region: Secondary | ICD-10-CM | POA: Diagnosis not present

## 2016-05-24 ENCOUNTER — Encounter (INDEPENDENT_AMBULATORY_CARE_PROVIDER_SITE_OTHER): Payer: Self-pay | Admitting: Orthopedic Surgery

## 2016-05-24 ENCOUNTER — Ambulatory Visit (INDEPENDENT_AMBULATORY_CARE_PROVIDER_SITE_OTHER): Payer: BLUE CROSS/BLUE SHIELD | Admitting: Orthopedic Surgery

## 2016-05-24 DIAGNOSIS — M5442 Lumbago with sciatica, left side: Secondary | ICD-10-CM

## 2016-05-28 ENCOUNTER — Other Ambulatory Visit (INDEPENDENT_AMBULATORY_CARE_PROVIDER_SITE_OTHER): Payer: Self-pay | Admitting: Radiology

## 2016-05-28 DIAGNOSIS — M5136 Other intervertebral disc degeneration, lumbar region: Secondary | ICD-10-CM

## 2016-06-01 ENCOUNTER — Encounter (INDEPENDENT_AMBULATORY_CARE_PROVIDER_SITE_OTHER): Payer: BLUE CROSS/BLUE SHIELD | Admitting: Physical Medicine and Rehabilitation

## 2016-06-12 ENCOUNTER — Encounter (INDEPENDENT_AMBULATORY_CARE_PROVIDER_SITE_OTHER): Payer: BLUE CROSS/BLUE SHIELD | Admitting: Physical Medicine and Rehabilitation

## 2016-06-14 ENCOUNTER — Ambulatory Visit (INDEPENDENT_AMBULATORY_CARE_PROVIDER_SITE_OTHER): Payer: Self-pay

## 2016-06-14 ENCOUNTER — Ambulatory Visit (INDEPENDENT_AMBULATORY_CARE_PROVIDER_SITE_OTHER): Payer: BLUE CROSS/BLUE SHIELD | Admitting: Physical Medicine and Rehabilitation

## 2016-06-14 ENCOUNTER — Encounter (INDEPENDENT_AMBULATORY_CARE_PROVIDER_SITE_OTHER): Payer: Self-pay | Admitting: Physical Medicine and Rehabilitation

## 2016-06-14 VITALS — BP 107/67 | HR 72 | Temp 98.4°F

## 2016-06-14 DIAGNOSIS — M47816 Spondylosis without myelopathy or radiculopathy, lumbar region: Secondary | ICD-10-CM | POA: Diagnosis not present

## 2016-06-14 MED ORDER — LIDOCAINE HCL (PF) 1 % IJ SOLN
0.3300 mL | Freq: Once | INTRAMUSCULAR | Status: AC
  Administered 2016-06-14: 0.3 mL

## 2016-06-14 MED ORDER — METHYLPREDNISOLONE ACETATE 80 MG/ML IJ SUSP
80.0000 mg | Freq: Once | INTRAMUSCULAR | Status: AC
  Administered 2016-06-14: 80 mg

## 2016-06-14 NOTE — Progress Notes (Signed)
[name] - 51 y.o. female MRN [id number]  Date of birth: 02/26/1966  Office Visit Note: Visit Date: 06/14/2016 PCP: Ernst BreachYSINGER, DAVID SHANE, PA-C Referred by: Jac Canavanysinger, David S, PA-C  Subjective: Chief Complaint  Patient presents with  . Lower Back - Pain   HPI: [name] is a pleasant 51 year old female who comes in today for a Left L4-5 Facet injection. She states that she is having back pain with pain down the left leg when she stands on that foot.  This causes shooting pain in the leg.  While she does have referral pattern pain there is no numbness or tingling. There is no focal weakness. Lumbar MRI shows mainly mild to moderate facet arthrosis at L4-5. There is no focal nerve compression or focal stenosis or disc herniation.    ROS Otherwise per HPI.  Assessment & Plan: Visit Diagnoses:  1. Spondylosis without myelopathy or radiculopathy, lumbar region     Plan: Findings:  Left L4-5 facet joint block.    Meds & Orders:  Meds ordered this encounter  Medications  . lidocaine (PF) (XYLOCAINE) 1 % injection 0.3 mL  . methylPREDNISolone acetate (DEPO-MEDROL) injection 80 mg    Orders Placed This Encounter  Procedures  . Facet Injection  . XR C-ARM NO REPORT    Follow-up: Return if symptoms worsen or fail to improve, for Dr. August Saucerean.   Procedures: No procedures performed  Lumbar Facet Joint Intra-Articular Injection(s) with Fluoroscopic Guidance  Patient: [name]      Date of Birth: 01/04/1966 MRN: [id number] PCP: Ernst BreachYSINGER, DAVID SHANE, PA-C      Visit Date: 06/14/2016   Universal Protocol:    Date/Time: 03/12/185:14 AM  Consent Given By: the patient  Position: PRONE   Additional Comments: Vital signs were monitored before and after the procedure. Patient was prepped and draped in the usual sterile fashion. The correct patient, procedure, and site was verified.   Injection Procedure Details:  Procedure Site One Meds  Administered:  Meds ordered this encounter  Medications  . lidocaine (PF) (XYLOCAINE) 1 % injection 0.3 mL  . methylPREDNISolone acetate (DEPO-MEDROL) injection 80 mg     Laterality: Left  Location/Site:  L4-L5  Needle size: 22 guage  Needle type: Spinal  Needle Placement: Articular  Findings:  -Contrast Used: 1 mL iohexol 180 mg iodine/mL   -Comments: Excellent flow of contrast producing a partial arthrogram.  Procedure Details: The fluoroscope beam is vertically oriented in AP, and the inferior recess is visualized beneath the lower pole of the inferior apophyseal process, which represents the target point for needle insertion. When direct visualization is difficult the target point is located at the medial projection of the vertebral pedicle. The region overlying each aforementioned target is locally anesthetized with a 1 to 2 ml. volume of 1% Lidocaine without Epinephrine.   The spinal needle was inserted into each of the above mentioned facet joints using biplanar fluoroscopic guidance. A 0.25 to 0.5 ml. volume of Isovue-250 was injected and a partial facet joint arthrogram was obtained. A single spot film was obtained of the resulting arthrogram.    One to 1.25 ml of the steroid/anesthetic solution was then injected into each of the facet joints noted above.   Additional Comments:  The patient tolerated the procedure well Dressing: Band-Aid    Post-procedure details: Patient was observed during the procedure. Post-procedure instructions were reviewed.  Patient left the clinic in stable condition.     Clinical History: IMPRESSION: 1. Mild multilevel  lumbar disc degeneration without spinal stenosis. 2. Mild neural foraminal stenosis on the left at L3-4 and bilaterally at L4-5. 3. Mild-to-moderate facet arthrosis, greatest at L4-5.   Electronically Signed   By: Sebastian Ache M.D.   On: 05/22/2016 17:29  She reports that she has never smoked. She has never used  smokeless tobacco.   Recent Labs  12/30/15 1437  HGBA1C 5.2    Objective:  VS:  HT:    WT:   BMI:     BP:107/67  HR:72bpm  TEMP:98.4 F (36.9 C)(Oral)  RESP:100 % Physical Exam  Musculoskeletal:  Patient ambulates without aid with good distal strength.    Ortho Exam Imaging: No results found.  Past Medical/Family/Surgical/Social History: Medications & Allergies reviewed per EMR Patient Active Problem List   Diagnosis Date Noted  . Acute back pain with sciatica, left 05/10/2016  . Gastroesophageal reflux disease without esophagitis 12/30/2015  . Rhinitis, allergic 12/30/2015  . Encounter for immunization 12/30/2015  . Need for prophylactic vaccination and inoculation against influenza 12/30/2015  . Obesity 12/30/2015  . Chronic low back pain 12/30/2015  . Routine physical examination 12/30/2015  . 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 Allergy   . Bursitis of shoulder, right   . GERD (gastroesophageal reflux disease)   . Heart murmur    as a child  . Knee pain, bilateral    intermittent pain, ran track in high school; OA  . Obesity   . Routine gynecological examination    Dr. Henderson Cloud  . Wisdom teeth extracted    Family History  Problem Relation Age of Onset  . Hypertension Mother   . Kidney disease Mother     hypertensive kidney failure, renal transplant  . Diabetes Mother   . Heart disease Mother 68    CHF  . Hypertension Father   . Sleep apnea Father   . Cancer Father     leukemia  . Fibroids Sister   . Stroke Neg Hx    Past Surgical History:  Procedure Laterality Date  . KNEE ARTHROSCOPY WITH DRILLING/MICROFRACTURE Left 10/06/2013   Procedure: KNEE ARTHROSCOPY WITH DRILLING/MICROFRACTURE AND ALLOGRAFT CARTLAGE IMPLANTATION, CHONDRAPLASTY OF TROCHIAL AND PATELLA;  Surgeon: Cammy Copa, MD;  Location: [facility] OR;  Service: Orthopedics;  Laterality: Left;  . KNEE SURGERY  1989   tendonitis,  right knee;   . TUBAL LIGATION     Social History   Occupational History  . Not on file.   Social History Main Topics  . Smoking status: Never Smoker  . Smokeless tobacco: Never Used  . Alcohol use No  . Drug use: No  . Sexual activity: Not on file

## 2016-06-14 NOTE — Progress Notes (Deleted)
   Office Visit Note   Patient: [name]           Date of Birth: 06/05/1965           MRN: [id number] Visit Date: 06/14/2016              Requested by: Jac Canavanavid S Tysinger, PA-C [address] [place], [place] [id number] PCP: Ernst BreachYSINGER, DAVID SHANE, PA-C   Assessment & Plan: Visit Diagnoses: No diagnosis found.  Plan: ***  Follow-Up Instructions: No Follow-up on file.   Orders:  No orders of the defined types were placed in this encounter.  No orders of the defined types were placed in this encounter.     Procedures: No procedures performed   Clinical Data: No additional findings.   Subjective: No chief complaint on file.   [name] comes in today for a Left L4-5 Facet injection. She states that she is having back pain with pain down the left leg when she stands on that foot.  This causes shooting pain in the leg.  She states that she does have a criver today.    Review of Systems   Objective: Vital Signs: There were no vitals taken for this visit.  Physical Exam  Ortho Exam  Specialty Comments:  No specialty comments available.  Imaging: No results found.   PMFS History: Patient Active Problem List   Diagnosis Date Noted  . Acute back pain with sciatica, left 05/10/2016  . Gastroesophageal reflux disease without esophagitis 12/30/2015  . Rhinitis, allergic 12/30/2015  . Encounter for immunization 12/30/2015  . Need for prophylactic vaccination and inoculation against influenza 12/30/2015  . Obesity 12/30/2015  . Chronic low back pain 12/30/2015  . Routine physical examination 12/30/2015  . 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 Allergy   . Bursitis of shoulder, right   . GERD (gastroesophageal reflux disease)   . Heart murmur    as a child  . Knee pain, bilateral    intermittent pain, ran track in high school; OA  . Obesity   . Routine gynecological  examination    Dr. Henderson CloudHorvath  . Wisdom teeth extracted     Family History  Problem Relation Age of Onset  . Hypertension Mother   . Kidney disease Mother     hypertensive kidney failure, renal transplant  . Diabetes Mother   . Heart disease Mother 5067    CHF  . Hypertension Father   . Sleep apnea Father   . Cancer Father     leukemia  . Fibroids Sister   . Stroke Neg Hx     Past Surgical History:  Procedure Laterality Date  . KNEE ARTHROSCOPY WITH DRILLING/MICROFRACTURE Left 10/06/2013   Procedure: KNEE ARTHROSCOPY WITH DRILLING/MICROFRACTURE AND ALLOGRAFT CARTLAGE IMPLANTATION, CHONDRAPLASTY OF TROCHIAL AND PATELLA;  Surgeon: Cammy CopaGregory Scott Dean, MD;  Location: [facility] OR;  Service: Orthopedics;  Laterality: Left;  . KNEE SURGERY  1989   tendonitis, right knee;   . TUBAL LIGATION     Social History   Occupational History  . Not on file.   Social History Main Topics  . Smoking status: Never Smoker  . Smokeless tobacco: Never Used  . Alcohol use No  . Drug use: No  . Sexual activity: Not on file

## 2016-06-14 NOTE — Patient Instructions (Signed)

## 2016-06-17 ENCOUNTER — Other Ambulatory Visit (INDEPENDENT_AMBULATORY_CARE_PROVIDER_SITE_OTHER): Payer: Self-pay | Admitting: Orthopedic Surgery

## 2016-06-17 DIAGNOSIS — M722 Plantar fascial fibromatosis: Secondary | ICD-10-CM

## 2016-06-17 DIAGNOSIS — M79671 Pain in right foot: Secondary | ICD-10-CM

## 2016-06-17 DIAGNOSIS — M79672 Pain in left foot: Principal | ICD-10-CM

## 2016-06-18 NOTE — Procedures (Signed)
Lumbar Facet Joint Intra-Articular Injection(s) with Fluoroscopic Guidance  Patient: [name]      Date of Birth: 03/22/1966 MRN: [id number] PCP: Ernst BreachYSINGER, DAVID SHANE, PA-C      Visit Date: 06/14/2016   Universal Protocol:    Date/Time: 03/12/185:14 AM  Consent Given By: the patient  Position: PRONE   Additional Comments: Vital signs were monitored before and after the procedure. Patient was prepped and draped in the usual sterile fashion. The correct patient, procedure, and site was verified.   Injection Procedure Details:  Procedure Site One Meds Administered:  Meds ordered this encounter  Medications  . lidocaine (PF) (XYLOCAINE) 1 % injection 0.3 mL  . methylPREDNISolone acetate (DEPO-MEDROL) injection 80 mg     Laterality: Left  Location/Site:  L4-L5  Needle size: 22 guage  Needle type: Spinal  Needle Placement: Articular  Findings:  -Contrast Used: 1 mL iohexol 180 mg iodine/mL   -Comments: Excellent flow of contrast producing a partial arthrogram.  Procedure Details: The fluoroscope beam is vertically oriented in AP, and the inferior recess is visualized beneath the lower pole of the inferior apophyseal process, which represents the target point for needle insertion. When direct visualization is difficult the target point is located at the medial projection of the vertebral pedicle. The region overlying each aforementioned target is locally anesthetized with a 1 to 2 ml. volume of 1% Lidocaine without Epinephrine.   The spinal needle was inserted into each of the above mentioned facet joints using biplanar fluoroscopic guidance. A 0.25 to 0.5 ml. volume of Isovue-250 was injected and a partial facet joint arthrogram was obtained. A single spot film was obtained of the resulting arthrogram.    One to 1.25 ml of the steroid/anesthetic solution was then injected into each of the facet joints noted above.   Additional Comments:  The patient  tolerated the procedure well Dressing: Band-Aid    Post-procedure details: Patient was observed during the procedure. Post-procedure instructions were reviewed.  Patient left the clinic in stable condition.

## 2016-06-19 NOTE — Telephone Encounter (Signed)
Dean pt Rx request, can you advise?  

## 2016-07-16 ENCOUNTER — Encounter: Payer: Self-pay | Admitting: Family Medicine

## 2016-07-16 ENCOUNTER — Ambulatory Visit (INDEPENDENT_AMBULATORY_CARE_PROVIDER_SITE_OTHER): Payer: BLUE CROSS/BLUE SHIELD | Admitting: Family Medicine

## 2016-07-16 VITALS — BP 110/60 | HR 70 | Temp 98.2°F | Resp 16 | Wt 168.6 lb

## 2016-07-16 DIAGNOSIS — J029 Acute pharyngitis, unspecified: Secondary | ICD-10-CM

## 2016-07-16 DIAGNOSIS — J069 Acute upper respiratory infection, unspecified: Secondary | ICD-10-CM | POA: Diagnosis not present

## 2016-07-16 LAB — POCT RAPID STREP A (OFFICE): RAPID STREP A SCREEN: NEGATIVE

## 2016-07-16 NOTE — Progress Notes (Signed)
Subjective: Chief Complaint  Patient presents with  . sick    started saturday night, scratchy throat and bilateral ear pain. feels lymph nodes are swollen     [name] is a 51 y.o. female who presents for a 3 day history of sore throat, chills, bilateral ear pain, and dry cough.    Denies fever, body aches, headache, sinus pain, chest pain, palpitations, shortness of breath, wheezing, abdominal pain, N/V/D.   History of seasonal allergies, currently untreated.  No recent antibiotics.  Does not smoke.  Denies history of asthma, bronchitis, or pneumonia.   Treatment to date: salt water gargles.  positive sick contacts.  No other aggravating or relieving factors.  No other c/o.  Reviewed allergies, medications, past medical, surgical, and social history.  ROS as in subjective.   Objective: Vitals:   07/16/16 1605  BP: 110/60  Pulse: 70  Resp: 16  Temp: 98.2 F (36.8 C)    General appearance: Alert, WD/WN, no distress, mildly ill appearing                             Skin: warm, no rash                           Head: no sinus tenderness                            Eyes: conjunctiva normal, corneas clear, PERRLA                            Ears: pearly TMs, external ear canals normal                          Nose: septum midline, turbinates swollen, with erythema and no discharge.              Mouth/throat: MMM, tongue normal, mild pharyngeal erythema                           Neck: supple, no adenopathy, no thyromegaly, nontender                          Heart: RRR, normal S1, S2, no murmurs                         Lungs: CTA bilaterally, no wheezes, rales, or rhonchi      Assessment: Sore throat - Plan: POCT rapid strep A  Acute URI    Plan: Discussed diagnosis and treatment of URI and acute pharyngitis. Rapid strep is negative and her symptoms appear to be related to a viral etiology.   Suggested symptomatic OTC remedies. Nasal saline spray for  congestion.  Tylenol or Ibuprofen OTC for fever and malaise.  Call/return in 2-3 days if symptoms aren't resolving.

## 2016-07-18 ENCOUNTER — Telehealth: Payer: Self-pay | Admitting: Medical

## 2016-07-18 MED ORDER — AZITHROMYCIN 250 MG PO TABS: ORAL_TABLET | ORAL | 0 refills | Status: DC

## 2016-07-18 NOTE — Telephone Encounter (Signed)
Called and left detailed message about med being sent to pharmacy.

## 2016-07-18 NOTE — Telephone Encounter (Signed)
Pt said she is not any better at all and is actually getting worse. She has sore throat, ears hurt, dry cough that has gotten worse and now her chest hurts and burns. Can she get meds to help? Send med to [facility] [address]

## 2016-07-18 NOTE — Telephone Encounter (Signed)
Ok to send in Z-pack for her. Please ask her if she has allergy to this. Take 2 tabs on day 1 then 1 tab on days 2-5. #6 No refills.

## 2016-08-01 ENCOUNTER — Other Ambulatory Visit (INDEPENDENT_AMBULATORY_CARE_PROVIDER_SITE_OTHER): Payer: Self-pay | Admitting: Orthopedic Surgery

## 2016-08-01 DIAGNOSIS — M79672 Pain in left foot: Principal | ICD-10-CM

## 2016-08-01 DIAGNOSIS — M722 Plantar fascial fibromatosis: Secondary | ICD-10-CM

## 2016-08-01 DIAGNOSIS — M79671 Pain in right foot: Secondary | ICD-10-CM

## 2016-08-01 NOTE — Telephone Encounter (Signed)
Ok to rf pls call thx

## 2016-08-22 NOTE — Progress Notes (Signed)
Patient was not seen today as planned.  She was called and MRI scan reviewed on phone with her.

## 2016-08-28 ENCOUNTER — Other Ambulatory Visit: Payer: BLUE CROSS/BLUE SHIELD

## 2016-08-29 ENCOUNTER — Other Ambulatory Visit (INDEPENDENT_AMBULATORY_CARE_PROVIDER_SITE_OTHER): Payer: BLUE CROSS/BLUE SHIELD

## 2016-08-29 DIAGNOSIS — Z23 Encounter for immunization: Secondary | ICD-10-CM | POA: Diagnosis not present

## 2016-09-03 ENCOUNTER — Other Ambulatory Visit (INDEPENDENT_AMBULATORY_CARE_PROVIDER_SITE_OTHER): Payer: Self-pay | Admitting: Orthopedic Surgery

## 2016-09-03 DIAGNOSIS — M722 Plantar fascial fibromatosis: Secondary | ICD-10-CM

## 2016-09-03 DIAGNOSIS — M79672 Pain in left foot: Principal | ICD-10-CM

## 2016-09-03 DIAGNOSIS — M79671 Pain in right foot: Secondary | ICD-10-CM

## 2016-09-04 NOTE — Telephone Encounter (Signed)
Rx request 

## 2016-09-04 NOTE — Telephone Encounter (Signed)
oik

## 2016-09-05 ENCOUNTER — Telehealth (INDEPENDENT_AMBULATORY_CARE_PROVIDER_SITE_OTHER): Payer: Self-pay | Admitting: Physical Medicine and Rehabilitation

## 2016-09-05 NOTE — Telephone Encounter (Signed)
If helped more than 50 %

## 2016-09-05 NOTE — Telephone Encounter (Signed)
I received this message but there is no text?

## 2016-09-05 NOTE — Telephone Encounter (Signed)
Scheduled for 09/18/16 at 1500 with driver.

## 2016-09-05 NOTE — Telephone Encounter (Signed)
Erroneous message

## 2016-09-18 ENCOUNTER — Ambulatory Visit (INDEPENDENT_AMBULATORY_CARE_PROVIDER_SITE_OTHER): Payer: Self-pay

## 2016-09-18 ENCOUNTER — Ambulatory Visit (INDEPENDENT_AMBULATORY_CARE_PROVIDER_SITE_OTHER): Payer: BLUE CROSS/BLUE SHIELD | Admitting: Physical Medicine and Rehabilitation

## 2016-09-18 ENCOUNTER — Encounter (INDEPENDENT_AMBULATORY_CARE_PROVIDER_SITE_OTHER): Payer: Self-pay | Admitting: Physical Medicine and Rehabilitation

## 2016-09-18 VITALS — BP 113/75 | HR 78

## 2016-09-18 DIAGNOSIS — M47816 Spondylosis without myelopathy or radiculopathy, lumbar region: Secondary | ICD-10-CM | POA: Diagnosis not present

## 2016-09-18 MED ORDER — METHYLPREDNISOLONE ACETATE 80 MG/ML IJ SUSP
80.0000 mg | Freq: Once | INTRAMUSCULAR | Status: AC
  Administered 2016-09-18: 80 mg

## 2016-09-18 MED ORDER — LIDOCAINE HCL (PF) 1 % IJ SOLN
2.0000 mL | Freq: Once | INTRAMUSCULAR | Status: AC
  Administered 2016-09-18: 2 mL

## 2016-09-18 NOTE — Patient Instructions (Signed)

## 2016-09-18 NOTE — Progress Notes (Deleted)
Patient states she did really well with last injection. Pain free for several weeks and then gradual increase in pain. Worse with laying. Stabbing pains at times.Ok with walking. Starting to radiated down left leg to mid thigh.

## 2016-09-19 ENCOUNTER — Encounter (INDEPENDENT_AMBULATORY_CARE_PROVIDER_SITE_OTHER): Payer: Self-pay | Admitting: Physical Medicine and Rehabilitation

## 2016-09-19 NOTE — Procedures (Signed)
Lumbar Facet Joint Intra-Articular Injection(s) with Fluoroscopic Guidance  Patient: [name]      Date of Birth: 04/11/1965 MRN: [id number] PCP: Jac Canavanysinger, David S, PA-C      Visit Date: 09/18/2016   [name] is a 51 year old female with mild facet arthropathy particularly at L4-5 the lumbar spine. We saw her March and completed diagnostic and therapeutic left L4-5 facet joint block and she did extremely well. She says she was pain-free for several weeks and then gradual increase in pain. She still better than she was initially but the pain started to return she gets a stabbing pain at times. She says when she is standing it'll radiate down the left leg to the mid thigh which is consistent with an L4-5 facet joint. We'll repeat the injection today and we talked about activity modification and the likelihood of this helping over time.  Universal Protocol:    Date/Time: 06/13/185:42 AM  Consent Given By: the patient  Position: PRONE   Additional Comments: Vital signs were monitored before and after the procedure. Patient was prepped and draped in the usual sterile fashion. The correct patient, procedure, and site was verified.   Injection Procedure Details:  Procedure Site One Meds Administered:  Meds ordered this encounter  Medications  . lidocaine (PF) (XYLOCAINE) 1 % injection 2 mL  . methylPREDNISolone acetate (DEPO-MEDROL) injection 80 mg     Laterality: Left  Location/Site:  L4-L5  Needle size: 22 guage  Needle type: Spinal  Needle Placement: Articular  Findings:  -Contrast Used: 0.5 mL iohexol 180 mg iodine/mL   -Comments: Excellent flow of contrast producing a partial arthrogram.  Procedure Details: The fluoroscope beam is vertically oriented in AP, and the inferior recess is visualized beneath the lower pole of the inferior apophyseal process, which represents the target point for needle insertion. When direct visualization is difficult  the target point is located at the medial projection of the vertebral pedicle. The region overlying each aforementioned target is locally anesthetized with a 1 to 2 ml. volume of 1% Lidocaine without Epinephrine.   The spinal needle was inserted into each of the above mentioned facet joints using biplanar fluoroscopic guidance. A 0.25 to 0.5 ml. volume of Isovue-250 was injected and a partial facet joint arthrogram was obtained. A single spot film was obtained of the resulting arthrogram.    One to 1.25 ml of the steroid/anesthetic solution was then injected into each of the facet joints noted above.   Additional Comments:  The patient tolerated the procedure well No complications occurred Dressing: Band-Aid    Post-procedure details: Patient was observed during the procedure. Post-procedure instructions were reviewed.  Patient left the clinic in stable condition.

## 2016-10-09 ENCOUNTER — Other Ambulatory Visit (INDEPENDENT_AMBULATORY_CARE_PROVIDER_SITE_OTHER): Payer: Self-pay

## 2016-10-09 DIAGNOSIS — M79671 Pain in right foot: Secondary | ICD-10-CM

## 2016-10-09 DIAGNOSIS — M79672 Pain in left foot: Principal | ICD-10-CM

## 2016-10-09 DIAGNOSIS — M722 Plantar fascial fibromatosis: Secondary | ICD-10-CM

## 2016-10-09 MED ORDER — DICLOFENAC SODIUM 75 MG PO TBEC: 75.0000 mg | DELAYED_RELEASE_TABLET | Freq: Two times a day (BID) | ORAL | 0 refills | Status: DC

## 2016-10-09 NOTE — Telephone Encounter (Signed)
Patient requested refill on diclofenac 75mg  tablet This was submitted.

## 2016-10-09 NOTE — Telephone Encounter (Signed)
thx

## 2016-10-12 DIAGNOSIS — M24875 Other specific joint derangements left foot, not elsewhere classified: Secondary | ICD-10-CM | POA: Diagnosis not present

## 2016-10-12 DIAGNOSIS — M5136 Other intervertebral disc degeneration, lumbar region: Secondary | ICD-10-CM | POA: Diagnosis not present

## 2016-10-12 DIAGNOSIS — M5030 Other cervical disc degeneration, unspecified cervical region: Secondary | ICD-10-CM | POA: Diagnosis not present

## 2016-12-25 ENCOUNTER — Ambulatory Visit (INDEPENDENT_AMBULATORY_CARE_PROVIDER_SITE_OTHER): Payer: BLUE CROSS/BLUE SHIELD | Admitting: Sports Medicine

## 2016-12-25 ENCOUNTER — Encounter: Payer: Self-pay | Admitting: Sports Medicine

## 2016-12-25 DIAGNOSIS — M79671 Pain in right foot: Secondary | ICD-10-CM

## 2016-12-25 DIAGNOSIS — M722 Plantar fascial fibromatosis: Secondary | ICD-10-CM

## 2016-12-25 MED ORDER — TRIAMCINOLONE ACETONIDE 10 MG/ML IJ SUSP: 10.0000 mg | Freq: Once | INTRAMUSCULAR | Status: DC

## 2016-12-25 MED ORDER — METHYLPREDNISOLONE 4 MG PO TBPK: ORAL_TABLET | ORAL | 0 refills | Status: DC

## 2016-12-25 NOTE — Progress Notes (Addendum)
Subjective: [name] is a 51 y.o. female patient return for follow up on heel pain. Reports that the right heel is hurting since last week; Shooting pain and throbbing all day. Pain 8/10. Nothing helps. No pain in left heel. Denies any other pedal complaints.   Patient Active Problem List   Diagnosis Date Noted  . Acute back pain with sciatica, left 05/10/2016  . Gastroesophageal reflux disease without esophagitis 12/30/2015  . Rhinitis, allergic 12/30/2015  . Encounter for immunization 12/30/2015  . Need for prophylactic vaccination and inoculation against influenza 12/30/2015  . Obesity 12/30/2015  . Chronic low back pain 12/30/2015  . Routine physical examination 12/30/2015  . 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 diclofenac (VOLTAREN) 75 MG EC tablet Take 1 tablet (75 mg total) by mouth 2 (two) times daily. 180 tablet 0  . Multiple Vitamins-Minerals (MULTI-VITAMIN GUMMIES PO) Take by mouth.     Current Facility-Administered Medications on File Prior to Visit  Medication Dose Route Frequency Provider Last Rate Last Dose  . triamcinolone acetonide (KENALOG) 10 MG/ML injection 10 mg  10 mg Other Once Asencion Islam, DPM        No Known Allergies  Objective: Physical Exam General: The patient is alert and oriented x3 in no acute distress.  Dermatology: Skin is warm, dry and supple bilateral lower extremities. Nails 1-10 are normal. There is no erythema, edema, no eccymosis, no open lesions present. Integument is otherwise unremarkable.  Vascular: Dorsalis Pedis pulse and Posterior Tibial pulse are 2/4 bilateral. Capillary fill time is immediate to all digits.  Neurological: Grossly intact to light touch with an achilles reflex of +2/5 and a negative Tinel's sign bilateral.  Musculoskeletal: Tenderness to palpation at the medial calcaneal tubercale and  through the insertion of the plantar fascia on the right foot. No pain with compression of calcaneus bilateral. No pain with tuning fork to calcaneus bilateral. No pain with calf compression bilateral. There is decreased Ankle joint range of motion bilateral. All other joints range of motion within normal limits bilateral. Pes planus bilateral. Strength 5/5 in all groups bilateral.   Gait: Unassisted, Antalgic avoid weight on right heel.   Assessment and Plan: Problem List Items Addressed This Visit    None    Visit Diagnoses    Plantar fasciitis of right foot    -  Primary   Relevant Medications   triamcinolone acetonide (KENALOG) 10 MG/ML injection 10 mg   methylPREDNISolone (MEDROL DOSEPAK) 4 MG TBPK tablet   Inflammatory heel pain, right       Relevant Medications   triamcinolone acetonide (KENALOG) 10 MG/ML injection 10 mg   methylPREDNISolone (MEDROL DOSEPAK) 4 MG TBPK tablet      -Complete examination performed.  -Previous Xrays reviewed -Re-Discussed with patient in detail the condition of plantar fasciitis, how this occurs and general treatment options. Explained both conservative and surgical treatments.  -After oral consent and aseptic prep, injected a mixture containing 1 ml of 2% plain lidocaine, 1 ml 0.5% plain marcaine, 0.5 ml of kenalog 10 and 0.5 ml of dexamethasone phosphate into right heel. Post-injection care discussed with patient.  -Rx Medrol dose pack to take as instructed and then resume Diclofenac  -Rx CAM boot to wear for 1 week then transition to tennis shoe with heel lift. -Hold off on stretching exercises at this time -Recommend patient to ice affected area  1-2x daily. -Patient to return to office in 2 weeks for follow up or sooner if problems or questions arise. If better will add on Night splint and encourage custom orthotics.   Asencion Islam, DPM

## 2016-12-31 ENCOUNTER — Ambulatory Visit (INDEPENDENT_AMBULATORY_CARE_PROVIDER_SITE_OTHER): Payer: BLUE CROSS/BLUE SHIELD | Admitting: Medical

## 2016-12-31 ENCOUNTER — Encounter: Payer: Self-pay | Admitting: Medical

## 2016-12-31 VITALS — BP 132/80 | HR 70 | Ht 61.5 in | Wt 173.2 lb

## 2016-12-31 DIAGNOSIS — Z7189 Other specified counseling: Secondary | ICD-10-CM | POA: Diagnosis not present

## 2016-12-31 DIAGNOSIS — R002 Palpitations: Secondary | ICD-10-CM | POA: Diagnosis not present

## 2016-12-31 DIAGNOSIS — Z13820 Encounter for screening for osteoporosis: Secondary | ICD-10-CM

## 2016-12-31 DIAGNOSIS — Z1211 Encounter for screening for malignant neoplasm of colon: Secondary | ICD-10-CM

## 2016-12-31 DIAGNOSIS — Z23 Encounter for immunization: Secondary | ICD-10-CM

## 2016-12-31 DIAGNOSIS — Z7185 Encounter for immunization safety counseling: Secondary | ICD-10-CM | POA: Insufficient documentation

## 2016-12-31 DIAGNOSIS — Z0000 Encounter for general adult medical examination without abnormal findings: Secondary | ICD-10-CM

## 2016-12-31 LAB — POCT URINALYSIS DIP (PROADVANTAGE DEVICE)
BILIRUBIN UA: NEGATIVE mg/dL
Bilirubin, UA: NEGATIVE
Blood, UA: NEGATIVE
Glucose, UA: NEGATIVE mg/dL
Nitrite, UA: NEGATIVE
Protein Ur, POC: NEGATIVE mg/dL
SPECIFIC GRAVITY, URINE: 1.03
Urobilinogen, Ur: NEGATIVE
pH, UA: 6 (ref 5.0–8.0)

## 2016-12-31 NOTE — Progress Notes (Signed)
Subjective:   HPI  [name] is a 51 y.o. female who presents for a complete physical.  Medical care team includes: [name] [name], Carrie Balo, PA-C here for primary care   Concerns: Seeing ortho recently for plantar fasctiits and heel spurs.   Is currently wearing a CAM walker on the right, ankle brace on the left.    Lately having 3 week hx/o jittery feeling at times with heart racing.  Denies worse stress, but symptoms will include some SOB.   No chest pain otherwise, no swelling.   Not drinking a lot of caffeine.    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   Reviewed their medical, surgical, family, social, medication, and allergy history and updated chart as appropriate.  Past Medical History:  Diagnosis Date  . Allergy   . Bursitis of shoulder, right   . GERD (gastroesophageal reflux disease)   . Heart murmur    as a child  . Knee pain, bilateral    intermittent pain, ran track in high school; OA  . Obesity   . Routine gynecological examination    [name]  . Wisdom teeth extracted     Past Surgical History:  Procedure Laterality Date  . KNEE ARTHROSCOPY WITH DRILLING/MICROFRACTURE Left 10/06/2013   Procedure: KNEE ARTHROSCOPY WITH DRILLING/MICROFRACTURE AND ALLOGRAFT CARTLAGE IMPLANTATION, CHONDRAPLASTY OF TROCHIAL AND PATELLA;  Surgeon: Carrie Copa, MD;  Location: [facility] OR;  Service: Orthopedics;  Laterality: Left;  . KNEE SURGERY  1989   tendonitis, right knee;   . TUBAL LIGATION      Social History   Social History  . Marital status: Married    Spouse name: N/A  . Number of children: N/A  . Years of education: N/A   Occupational History  . Not on file.   Social History Main Topics  . Smoking status: Never Smoker  . Smokeless tobacco: Never Used  . Alcohol use No  . Drug use: No  . Sexual activity: Not on file   Other Topics Concern  . 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  Hobbies - walking, makes jewelry, likes adult coloring books.  12/2016    Family History  Problem Relation Age of Onset  . Hypertension Mother   . Kidney disease Mother        hypertensive kidney failure, renal transplant  . Diabetes Mother   . Heart disease Mother 38       CHF  . Hypertension Father   . Sleep apnea Father   . Cancer Father        leukemia  . Fibroids Sister   . Hypertension Sister   . Stroke Neg Hx      Current Outpatient Prescriptions:  .  diclofenac (VOLTAREN) 75 MG EC tablet, Take 1 tablet (75 mg total) by mouth 2 (two) times daily., Disp: 180 tablet, Rfl: 0 .  Multiple Vitamins-Minerals (MULTI-VITAMIN GUMMIES PO), Take by mouth., Disp: , Rfl:   Current Facility-Administered Medications:  .  triamcinolone acetonide (KENALOG) 10 MG/ML injection 10 mg, 10 mg, Other, Once, [name], [name], [name] .  triamcinolone acetonide (KENALOG) 10 MG/ML injection 10 mg, 10 mg, Other, Once, [name], [name], [name]  No Known Allergies   Review of Systems Constitutional: -fever, -chills, -sweats, -unexpected weight  change, -decreased appetite, -fatigue Allergy: -sneezing, -itching, -congestion Dermatology: -changing moles, --rash, -lumps ENT: -runny nose, -ear pain, -sore throat, -hoarseness, -sinus pain, -teeth pain, - ringing in ears, -hearing loss, -nosebleeds Cardiology: -chest pain, +palpitations, -swelling, -difficulty breathing when lying flat, -waking up short of breath Respiratory: -cough, +shortness of breath, -difficulty breathing with exercise or exertion, -wheezing, -coughing up blood Gastroenterology: -abdominal pain, -nausea, -vomiting, -diarrhea, -constipation, -blood in stool, -changes in bowel movement, -difficulty swallowing or eating Hematology: -bleeding,  -bruising  Musculoskeletal: -joint aches, -muscle aches, -joint swelling, -back pain, -neck pain, -cramping, -changes in gait Ophthalmology: denies vision changes, eye redness, itching, discharge Urology: -burning with urination, -difficulty urinating, -blood in urine, -urinary frequency, -urgency, -incontinence Neurology: -headache, -weakness, -tingling, -numbness, -memory loss, -falls, -dizziness Psychology: -depressed mood, -agitation, -sleep problems     Objective:   Physical Exam  BP 132/80   Pulse 70   Ht 5' 1.5" (1.562 m)   Wt 173 lb 3.2 oz (78.6 kg)   SpO2 99%   BMI 32.20 kg/m   Wt Readings from Last 3 Encounters:  12/31/16 173 lb 3.2 oz (78.6 kg)  07/16/16 168 lb 9.6 oz (76.5 kg)  12/30/15 164 lb (74.4 kg)    General appearance: alert, no distress, WD/WN, AA female Skin: right distal lateral lower leg with brown 4mm diameter somewhat tender lesions that feels some what roundish beneath skin, somewhat blunted border, but uniform brown color, no other worrisome lesions HEENT: normocephalic, conjunctiva/corneas normal, sclerae anicteric, PERRLA, EOMi, nares patent, no discharge or erythema, pharynx normal Oral cavity: MMM, tongue normal, teeth - in good repair Neck: supple, no lymphadenopathy, no thyromegaly, no masses, normal ROM, no bruits Chest: non tender, normal shape and expansion Heart: RRR, normal S1, S2, no murmurs Lungs: CTA bilaterally, no wheezes, rhonchi, or rales Abdomen: +bs, soft, non tender, non distended, no masses, no hepatomegaly, no splenomegaly, no bruits Back: mild tenderness of left lumbar paraspinal region, otherwise non tender, normal ROM, no scoliosis Musculoskeletal: upper extremities non tender, no obvious deformity, normal ROM throughout, lower extremities non tender, no obvious deformity, normal ROM throughout Extremities: no edema, no cyanosis, no clubbing Pulses: 2+ symmetric, upper and lower extremities, normal cap refill Neurological:  alert, oriented x 3, CN2-12 intact, strength normal upper extremities and lower extremities, sensation normal throughout, DTRs 2+ throughout, no cerebellar signs, gait normal Psychiatric: normal affect, behavior normal, pleasant  Breast/gyn/rectal - deferred to gyn    Adult ECG Report  Indication: palpitations  Rate: 69 bpm  Rhythm: normal sinus rhythm  QRS Axis: -13 degrees  PR Interval:  QRS Duration: 84ms  QTc: 405 ms  Conduction Disturbances: low voltage, ST wave abnormality  Other Abnormalities: none  Patient's cardiac risk factors are: obesity (BMI >= 30 kg/m2).  EKG comparison: none  Narrative Interpretation: abnormal EKG    Assessment and Plan :    Encounter Diagnoses  Name Primary?  . Routine general medical examination at a health care facility Yes  . Palpitation   . Need for influenza vaccination   . Screen for colon cancer   . Vaccine counseling   . Screening for osteoporosis     Physical exam - discussed healthy lifestyle, diet, exercise, preventative care, vaccinations, and addressed their concerns.   Routine labs today.    See your eye doctor yearly for routine vision care. See your dentist yearly for routine dental care including hygiene visits twice yearly. See your gynecologist yearly for routine gynecological care. Counseled on the influenza virus vaccine.  Vaccine information sheet given.  Influenza vaccine given after consent obtained. Palpitations - EKG reviewed, pending labs, consider cardiology consult Go for bone density screen She will get [place] copy of Td vaccine from 2017 I recommend you have a shingles vaccine to help prevent shingles or herpes zoster outbreak.   Please call your insurer to inquire about coverage for the Shingrix vaccine given in 2 doses.   Some insurers cover this vaccine after age 82, some cover this after age 41.  If your insurer covers this, then call to schedule appointment to have this vaccine here. Follow-up pending  labs  Illene was seen today for annual exam.  Diagnoses and all orders for this visit:  Routine general medical examination at a health care facility -     POCT Urinalysis DIP (Proadvantage Device) -     Hemoglobin A1c -     TSH -     CBC -     Basic metabolic panel -     Lipid panel -     EKG 12-Lead  Palpitation -     TSH -     CBC -     Basic metabolic panel -     EKG 12-Lead  Need for influenza vaccination -     Flu Vaccine QUAD 36+ mos IM  Screen for colon cancer -     Ambulatory referral to Gastroenterology  Vaccine counseling  Screening for osteoporosis

## 2017-01-01 ENCOUNTER — Encounter: Payer: Self-pay | Admitting: Gastroenterology

## 2017-01-01 LAB — BASIC METABOLIC PANEL
BUN: 19 mg/dL (ref 7–25)
CO2: 29 mmol/L (ref 20–32)
Calcium: 9.4 mg/dL (ref 8.6–10.4)
Chloride: 105 mmol/L (ref 98–110)
Creat: 0.87 mg/dL (ref 0.50–1.05)
Glucose, Bld: 88 mg/dL (ref 65–99)
Potassium: 3.5 mmol/L (ref 3.5–5.3)
SODIUM: 143 mmol/L (ref 135–146)

## 2017-01-01 LAB — CBC
HCT: 35.5 % (ref 35.0–45.0)
HEMOGLOBIN: 11.9 g/dL (ref 11.7–15.5)
MCH: 28.8 pg (ref 27.0–33.0)
MCHC: 33.5 g/dL (ref 32.0–36.0)
MCV: 86 fL (ref 80.0–100.0)
MPV: 9.4 fL (ref 7.5–12.5)
Platelets: 317 10*3/uL (ref 140–400)
RBC: 4.13 10*6/uL (ref 3.80–5.10)
RDW: 12.6 % (ref 11.0–15.0)
WBC: 8 10*3/uL (ref 3.8–10.8)

## 2017-01-01 LAB — LIPID PANEL
Cholesterol: 181 mg/dL (ref <200)
HDL: 75 mg/dL (ref >50)
LDL CHOLESTEROL (CALC): 83 mg/dL
NON-HDL CHOLESTEROL (CALC): 106 mg/dL (ref <130)
TRIGLYCERIDES: 124 mg/dL (ref <150)
Total CHOL/HDL Ratio: 2.4 (calc) (ref <5.0)

## 2017-01-01 LAB — HEMOGLOBIN A1C
Hgb A1c MFr Bld: 5.3 % of total Hgb (ref <5.7)
Mean Plasma Glucose: 105 (calc)
eAG (mmol/L): 5.8 (calc)

## 2017-01-01 LAB — TSH: TSH: 2.69 m[IU]/L

## 2017-01-01 NOTE — Addendum Note (Signed)
Addended by: Winn Jock on: 01/01/2017 09:13 AM   Modules accepted: Orders

## 2017-01-08 ENCOUNTER — Encounter: Payer: Self-pay | Admitting: Sports Medicine

## 2017-01-08 ENCOUNTER — Ambulatory Visit (INDEPENDENT_AMBULATORY_CARE_PROVIDER_SITE_OTHER): Payer: BLUE CROSS/BLUE SHIELD | Admitting: Sports Medicine

## 2017-01-08 DIAGNOSIS — M722 Plantar fascial fibromatosis: Secondary | ICD-10-CM | POA: Diagnosis not present

## 2017-01-08 DIAGNOSIS — M79671 Pain in right foot: Secondary | ICD-10-CM

## 2017-01-08 MED ORDER — TRIAMCINOLONE ACETONIDE 40 MG/ML IJ SUSP: 20.0000 mg | Freq: Once | INTRAMUSCULAR | Status: DC

## 2017-01-08 MED ORDER — TRAMADOL HCL 50 MG PO TABS: 50.0000 mg | ORAL_TABLET | Freq: Three times a day (TID) | ORAL | 0 refills | Status: DC | PRN

## 2017-01-08 NOTE — Progress Notes (Signed)
Subjective: [name] is a 51 y.o. female patient return for follow up on heel pain. Reports that boot helps; Shooting pain and throbbing that has eased off and is not constant. Pain still 8/10. Tolerated dose pak with no issues. Denies any other pedal complaints.   Patient Active Problem List   Diagnosis Date Noted  . Palpitation 12/31/2016  . Vaccine counseling 12/31/2016  . Screen for colon cancer 12/31/2016  . Acute back pain with sciatica, left 05/10/2016  . Gastroesophageal reflux disease without esophagitis 12/30/2015  . Rhinitis, allergic 12/30/2015  . Encounter for immunization 12/30/2015  . Need for influenza vaccination 12/30/2015  . Obesity 12/30/2015  . Chronic low back pain 12/30/2015  . Screening for osteoporosis 12/30/2015  . 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 diclofenac (VOLTAREN) 75 MG EC tablet Take 1 tablet (75 mg total) by mouth 2 (two) times daily. 180 tablet 0  . Multiple Vitamins-Minerals (MULTI-VITAMIN GUMMIES PO) Take by mouth.     Current Facility-Administered Medications on File Prior to Visit  Medication Dose Route Frequency Provider Last Rate Last Dose  . triamcinolone acetonide (KENALOG) 10 MG/ML injection 10 mg  10 mg Other Once Asencion Islam, DPM      . triamcinolone acetonide (KENALOG) 10 MG/ML injection 10 mg  10 mg Other Once Asencion Islam, DPM        No Known Allergies  Objective: Physical Exam General: The patient is alert and oriented x3 in no acute distress.  Dermatology: Skin is warm, dry and supple bilateral lower extremities. Nails 1-10 are normal. There is no erythema, edema, no eccymosis, no open lesions present. Integument is otherwise unremarkable.  Vascular: Dorsalis Pedis pulse and Posterior Tibial pulse are 2/4 bilateral. Capillary fill time is immediate to all digits.  Neurological: Grossly intact  to light touch with an achilles reflex of +2/5 and a negative Tinel's sign bilateral.  Musculoskeletal: Tenderness to palpation at the medial>lateral calcaneal tubercale and through the insertion of the plantar fascia on the right foot. Mild pain at insertion of achilles on right. No gap or dell. No pain with compression of calcaneus bilateral. No pain with tuning fork to calcaneus bilateral. No pain with calf compression bilateral. There is decreased Ankle joint range of motion bilateral. All other joints range of motion within normal limits bilateral. Pes planus bilateral. Strength 5/5 in all groups bilateral.   Gait: Unassisted, Antalgic avoid weight on right heel.   Assessment and Plan: Problem List Items Addressed This Visit    None    Visit Diagnoses    Plantar fasciitis of right foot    -  Primary   Relevant Medications   triamcinolone acetonide (KENALOG-40) injection 20 mg (Start on 01/08/2017  8:45 AM)   Inflammatory heel pain, right       Relevant Medications   triamcinolone acetonide (KENALOG-40) injection 20 mg (Start on 01/08/2017  8:45 AM)   traMADol (ULTRAM) 50 MG tablet      -Complete examination performed.  -Previous Xrays reviewed -Re-Discussed with patient in detail the condition of plantar fasciitis, how this occurs and general treatment options. Explained both conservative and surgical treatments.  -After oral consent and aseptic prep, injected a mixture containing 1 ml of 2% plain lidocaine, 1 ml 0.5% plain marcaine, 0.5 ml of kenalog 40 and 0.5 ml of dexamethasone phosphate into right heel. Post-injection care discussed with patient.  -  Rx Tramadol  -Continue with CAM boot  -Hold off on stretching exercises at this time -Work note given and recommend patient to ice during lunch break -Recommend patient to ice affected area 1-2x daily. -Patient to return to office in 2 weeks for follow up or sooner if problems or questions arise. If better, will add on Night splint  and encourage custom orthotics to prevent recurrence.   Asencion Islam, DPM

## 2017-01-15 ENCOUNTER — Emergency Department (HOSPITAL_COMMUNITY): Payer: BLUE CROSS/BLUE SHIELD

## 2017-01-15 ENCOUNTER — Emergency Department (HOSPITAL_COMMUNITY)
Admission: EM | Admit: 2017-01-15 | Discharge: 2017-01-15 | Disposition: A | Discharge status: Home / Self Care | Payer: BLUE CROSS/BLUE SHIELD | Attending: Emergency Medicine | Admitting: Emergency Medicine

## 2017-01-15 ENCOUNTER — Encounter (HOSPITAL_COMMUNITY): Payer: Self-pay | Admitting: *Deleted

## 2017-01-15 DIAGNOSIS — R5383 Other fatigue: Secondary | ICD-10-CM

## 2017-01-15 DIAGNOSIS — R072 Precordial pain: Secondary | ICD-10-CM | POA: Diagnosis not present

## 2017-01-15 DIAGNOSIS — R079 Chest pain, unspecified: Secondary | ICD-10-CM | POA: Diagnosis not present

## 2017-01-15 DIAGNOSIS — Z79899 Other long term (current) drug therapy: Secondary | ICD-10-CM | POA: Insufficient documentation

## 2017-01-15 DIAGNOSIS — R0602 Shortness of breath: Secondary | ICD-10-CM | POA: Insufficient documentation

## 2017-01-15 DIAGNOSIS — K2970 Gastritis, unspecified, without bleeding: Secondary | ICD-10-CM

## 2017-01-15 LAB — LIPASE, BLOOD: LIPASE: 34 U/L (ref 11–51)

## 2017-01-15 LAB — COMPREHENSIVE METABOLIC PANEL
ALT: 22 U/L (ref 14–54)
AST: 24 U/L (ref 15–41)
Albumin: 4.4 g/dL (ref 3.5–5.0)
Alkaline Phosphatase: 57 U/L (ref 38–126)
Anion gap: 8 (ref 5–15)
BUN: 13 mg/dL (ref 6–20)
CHLORIDE: 106 mmol/L (ref 101–111)
CO2: 27 mmol/L (ref 22–32)
CREATININE: 0.91 mg/dL (ref 0.44–1.00)
Calcium: 9.9 mg/dL (ref 8.9–10.3)
GFR calc Af Amer: >60 mL/min (ref >60)
Glucose, Bld: 88 mg/dL (ref 65–99)
Potassium: 3.8 mmol/L (ref 3.5–5.1)
Sodium: 141 mmol/L (ref 135–145)
Total Bilirubin: 0.8 mg/dL (ref 0.3–1.2)
Total Protein: 8.3 g/dL — ABNORMAL HIGH (ref 6.5–8.1)

## 2017-01-15 LAB — CBC
HEMATOCRIT: 41.4 % (ref 36.0–46.0)
HEMOGLOBIN: 13.3 g/dL (ref 12.0–15.0)
MCH: 28.9 pg (ref 26.0–34.0)
MCHC: 32.1 g/dL (ref 30.0–36.0)
MCV: 90 fL (ref 78.0–100.0)
Platelets: 290 10*3/uL (ref 150–400)
RBC: 4.6 MIL/uL (ref 3.87–5.11)
RDW: 13.5 % (ref 11.5–15.5)
WBC: 6 10*3/uL (ref 4.0–10.5)

## 2017-01-15 LAB — I-STAT TROPONIN, ED: Troponin i, poc: 0 ng/mL (ref 0.00–0.08)

## 2017-01-15 MED ORDER — RANITIDINE HCL 150 MG PO TABS: 150.0000 mg | ORAL_TABLET | Freq: Two times a day (BID) | ORAL | 0 refills | Status: DC

## 2017-01-15 MED ORDER — FAMOTIDINE IN NACL 20-0.9 MG/50ML-% IV SOLN
20.0000 mg | Freq: Once | INTRAVENOUS | Status: AC
  Administered 2017-01-15: 20 mg via INTRAVENOUS
  Filled 2017-01-15: qty 50

## 2017-01-15 MED ORDER — GI COCKTAIL ~~LOC~~
30.0000 mL | Freq: Once | ORAL | Status: AC
  Administered 2017-01-15: 30 mL via ORAL
  Filled 2017-01-15: qty 30

## 2017-01-15 NOTE — Discharge Instructions (Signed)
Your work up today has been reassuring, your pain is likely from gastritis or an ulcer. You will need to take zantac as directed, and avoid spicy/fatty/acidic foods, avoid soda/coffee/tea/alcohol. Avoid laying down flat within 30 minutes of eating. Avoid NSAIDs like ibuprofen/aleve/motrin/etc on an empty stomach. May consider using over the counter tums/maalox as needed for additional relief. Use tylenol as needed for pain. Follow up with the your regular doctor in 5-7 days for recheck of symptoms, and with your cardiologist in the next 1-2 weeks for recheck of symptoms and ongoing management of your complaints. Return to the ER for changes or worsening symptoms.

## 2017-01-15 NOTE — ED Provider Notes (Signed)
[facility]-EMERGENCY DEPT Provider Note   CSN: [id number] Arrival date & time: 01/15/17  1152     History   Chief Complaint Chief Complaint  Patient presents with  . Chest Pain    HPI [name] is a 51 y.o. female with a PMHx of GERD, obesity, and chronic back pain, who presents to the ED with complaints of central chest pain that has been intermittent 3 weeks but has become more constant and gradually worsening over the last 2 days. Patient states that her symptoms initially began the day before she went for her physical on 12/31/16, and after her PCP evaluation they referred her to cardiology she does not have this appointment until 02/11/17. She initially just thought it was indigestion however because it has become more constant she came in for further evaluation. Chart review reveals that she was seen by her PCP Dr. Aleen Campi on 12/31/16 for a physical, and complained of palpitations; had EKG done which was reported to have ST changes and low-voltage; had labs done which were unremarkable including TSH, Hgb A1C 5.3, lipids all WNL, CBC/BMP WNL; she was referred to cardiology for further work up and possible holter monitor testing. She describes the pain as 8/10 constant pressure-like nonradiating pain in the center of her chest, worse with laying down, unchanged with exertion or inspiration, and with no treatments tried prior to arrival. She reports associated intermittent shortness of breath, fatigue, and two-pillow orthopnea stating that she always uses 2 pillows behind her head but has now needed 2 pillows behind her back as well. She admits to consuming a moderate amount of caffeine daily, and states she uses NSAIDs a few times a month; denies EtOH use.   She denies diaphoresis, lightheadedness, fevers, chills, cough/URI symptoms, LE swelling, recent travel/surgery/immobilization, estrogen use, personal hx of DVT/PE, abd pain, N/V/D/C, hematuria, dysuria, myalgias, arthralgias,  claudication, PND, weight gain, numbness, tingling, focal weakness, or any other complaints at this time. She is a non-smoker. +FHx of DVT and MI in her mother; no other family hx of cardiac disease.   The history is provided by the patient and medical records. No language interpreter was used.  Chest Pain   This is a new problem. The current episode started more than 1 week ago. The problem occurs constantly (intermittent x3 wks, constant x2 days). The problem has been gradually worsening. The pain is associated with rest. The pain is present in the substernal region. The pain is at a severity of 8/10. The pain is moderate. The quality of the pain is described as pressure-like. The pain does not radiate. Duration of episode(s) is 3 weeks. The symptoms are aggravated by certain positions. Associated symptoms include orthopnea and shortness of breath. Pertinent negatives include no abdominal pain, no claudication, no cough, no diaphoresis, no fever, no lower extremity edema, no nausea, no numbness, no PND, no vomiting and no weakness. She has tried nothing for the symptoms. The treatment provided no relief.  Pertinent negatives for past medical history include no CAD, no diabetes, no DVT, no hyperlipidemia, no hypertension, no MI and no PE.  Her family medical history is significant for CAD.    Past Medical History:  Diagnosis Date  . Allergy   . Bursitis of shoulder, right   . GERD (gastroesophageal reflux disease)   . Heart murmur    as a child  . Knee pain, bilateral    intermittent pain, ran track in high school; OA  . Obesity   . Routine  gynecological examination    Dr. Henderson Cloud  . Wisdom teeth extracted     Patient Active Problem List   Diagnosis Date Noted  . Palpitation 12/31/2016  . Vaccine counseling 12/31/2016  . Screen for colon cancer 12/31/2016  . Acute back pain with sciatica, left 05/10/2016  . Gastroesophageal reflux disease without esophagitis 12/30/2015  . Rhinitis,  allergic 12/30/2015  . Encounter for immunization 12/30/2015  . Need for influenza vaccination 12/30/2015  . Obesity 12/30/2015  . Chronic low back pain 12/30/2015  . Screening for osteoporosis 12/30/2015  . 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 KNEE ARTHROSCOPY WITH DRILLING/MICROFRACTURE Left 10/06/2013   Procedure: KNEE ARTHROSCOPY WITH DRILLING/MICROFRACTURE AND ALLOGRAFT CARTLAGE IMPLANTATION, CHONDRAPLASTY OF TROCHIAL AND PATELLA;  Surgeon: Cammy Copa, MD;  Location: [facility] OR;  Service: Orthopedics;  Laterality: Left;  . KNEE SURGERY  1989   tendonitis, right knee;   . TUBAL LIGATION      OB History    No data available       Home Medications    Prior to Admission medications   Medication Sig Start Date End Date Taking? Authorizing Provider  diclofenac (VOLTAREN) 75 MG EC tablet Take 1 tablet (75 mg total) by mouth 2 (two) times daily. 10/09/16   Cammy Copa, MD  Multiple Vitamins-Minerals (MULTI-VITAMIN GUMMIES PO) Take by mouth.    [provider]  traMADol (ULTRAM) 50 MG tablet Take 1 tablet (50 mg total) by mouth every 8 (eight) hours as needed. 01/08/17   Asencion Islam, DPM    Family History Family History  Problem Relation Age of Onset  . Hypertension Mother   . Kidney disease Mother        hypertensive kidney failure, renal transplant  . Diabetes Mother   . Heart disease Mother 5       CHF  . Hypertension Father   . Sleep apnea Father   . Cancer Father        leukemia  . Fibroids Sister   . Hypertension Sister   . Stroke Neg Hx     Social History Social History  Substance Use Topics  . Smoking status: Never Smoker  . Smokeless tobacco: Never Used  . Alcohol use No     Allergies   Patient has no known allergies.   Review of Systems Review of Systems  Constitutional: Positive for fatigue. Negative for chills, diaphoresis, fever and unexpected weight  change.  HENT: Negative for rhinorrhea and sore throat.   Respiratory: Positive for shortness of breath. Negative for cough.        +2 pillow orthopnea (under back; always uses 2 pillows under head) No PND  Cardiovascular: Positive for chest pain and orthopnea. Negative for claudication, leg swelling and PND.  Gastrointestinal: Negative for abdominal pain, constipation, diarrhea, nausea and vomiting.  Genitourinary: Negative for dysuria and hematuria.  Musculoskeletal: Negative for arthralgias and myalgias.  Skin: Negative for color change.  Allergic/Immunologic: Negative for immunocompromised state.  Neurological: Negative for weakness, light-headedness and numbness.  Psychiatric/Behavioral: Negative for confusion.   All other systems reviewed and are negative for acute change except as noted in the HPI.    Physical Exam Updated Vital Signs BP 111/71 (BP Location: Right Arm)   Pulse 76   Temp 98 F (36.7 C) (Oral)   Resp 16   Ht 5' 1.5" (1.562 m)   Wt 77.1 kg (170 lb)   SpO2  100%   BMI 31.60 kg/m   Physical Exam  Constitutional: She is oriented to person, place, and time. Vital signs are normal. She appears well-developed and well-nourished.  Non-toxic appearance. No distress.  Afebrile, nontoxic, NAD  HENT:  Head: Normocephalic and atraumatic.  Mouth/Throat: Oropharynx is clear and moist and mucous membranes are normal.  Eyes: Conjunctivae and EOM are normal. Right eye exhibits no discharge. Left eye exhibits no discharge.  Neck: Normal range of motion. Neck supple.  Cardiovascular: Normal rate, regular rhythm, normal heart sounds and intact distal pulses.  Exam reveals no gallop and no friction rub.   No murmur heard. RRR, nl s1/s2, no m/r/g, distal pulses intact, no pedal edema   Pulmonary/Chest: Effort normal and breath sounds normal. No respiratory distress. She has no decreased breath sounds. She has no wheezes. She has no rhonchi. She has no rales. She exhibits no  tenderness, no crepitus, no deformity and no retraction.  CTAB in all lung fields, no w/r/r, no hypoxia or increased WOB, speaking in full sentences, SpO2 100% on RA Chest wall nonTTP without crepitus, deformities, or retractions   Abdominal: Soft. Normal appearance and bowel sounds are normal. She exhibits no distension. There is tenderness in the epigastric area. There is no rigidity, no rebound, no guarding, no CVA tenderness, no tenderness at McBurney's point and negative Murphy's sign.  Soft, nondistended, +BS throughout, with very mild epigastric TTP, no r/g/r, neg murphy's, neg mcburney's, no CVA TTP   Musculoskeletal: Normal range of motion.  MAE x4 Strength and sensation grossly intact in all extremities Distal pulses intact No pedal edema, neg homan's bilaterally   Neurological: She is alert and oriented to person, place, and time. She has normal strength. No sensory deficit.  Skin: Skin is warm, dry and intact. No rash noted.  Psychiatric: She has a normal mood and affect.  Nursing note and vitals reviewed.    ED Treatments / Results  Labs (all labs ordered are listed, but only abnormal results are displayed) Labs Reviewed  COMPREHENSIVE METABOLIC PANEL - Abnormal; Notable for the following:       Result Value   Total Protein 8.3 (*)    All other components within normal limits  CBC  LIPASE, BLOOD  I-STAT TROPONIN, ED    EKG  EKG Interpretation  Date/Time:  Tuesday January 15 2017 12:33:46 EDT Ventricular Rate:  70 PR Interval:    QRS Duration: 98 QT Interval:  388 QTC Calculation: 419 R Axis:   -2 Text Interpretation:  Sinus rhythm Low voltage, precordial leads Nonspecific T abnormalities, anterior leads no pror EKG T-wave abnormalities in anteriolateral  Confirmed by Crista Curb [phone]) on 01/15/2017 4:34:10 PM       Radiology Dg Chest 2 View  Result Date: 01/15/2017 CLINICAL DATA:  Chest pain. EXAM: CHEST  2 VIEW COMPARISON:  None. FINDINGS: The cardial  mediastinal silhouette is at the upper limits of normal in size. Normal pulmonary vascularity. No focal consolidation, pleural effusion, or pneumothorax. Symmetric densities over the bilateral apices are consistent with electrode pads. No acute osseous abnormality. IMPRESSION: No active cardiopulmonary disease. Electronically Signed   By: Obie Dredge M.D.   On: 01/15/2017 14:10    Procedures Procedures (including critical care time)  Medications Ordered in ED Medications  famotidine (PEPCID) IVPB 20 mg premix (20 mg Intravenous New Bag/Given 01/15/17 1655)  gi cocktail (Maalox,Lidocaine,Donnatal) (30 mLs Oral Given 01/15/17 1643)     Initial Impression / Assessment and Plan / ED Course  I have reviewed the triage vital signs and the nursing notes.  Pertinent labs & imaging results that were available during my care of the patient were reviewed by me and considered in my medical decision making (see chart for details).     51 y.o. female here with intermittent CP x3wks that became more constant x2 days, with associated fatigue and intermittent SOB. States it hurts worse to lay back. On exam, mild epigastric TTP, neg murphy's, no RUQ TTP, clear lungs, no chest wall TTP, no hypoxia or tachycardia, no pedal edema. Doubt PE. EKG with low voltage leads and some TWI anterolaterally but no other acute ischemic findings, looks fairly similar to EKG from PCPs office 12/31/16. CXR negative. Will get labs, give GI cocktail and pepcid, however pt declines wanting anything else for pain right now. Doubt need for abdominal ultrasound at this time. Will reassess after work up completed.  5:29 PM CBC WNL. CMP WNL. Lipase WNL. Troponin negative. Pt feeling better after GI cocktail and pepcid, symptoms starting to improve and states they're much better than when she arrived. Symptoms likely due to gastritis/indigestion/PUD/GERD, doubt pericarditis or other acute cardiopulmonary etiology. Discussed  diet/lifestyle modifications for symptoms, avoidance of caffeine, will start on zantac, advised tylenol and avoidance/sparing use of NSAIDs only on full stomach, discussed other OTC remedies for symptomatic relief, and f/up with PCP in 5-7 days for recheck of symptoms and ongoing evaluation/management. Also advised f/up with cardiology in 1-2 weeks for recheck and ongoing management. I explained the diagnosis and have given explicit precautions to return to the ER including for any other new or worsening symptoms. The patient understands and accepts the medical plan as it's been dictated and I have answered their questions. Discharge instructions concerning home care and prescriptions have been given. The patient is STABLE and is discharged to home in good condition.     Final Clinical Impressions(s) / ED Diagnoses   Final diagnoses:  Precordial chest pain  Fatigue, unspecified type  SOB (shortness of breath)  Gastritis, presence of bleeding unspecified, unspecified chronicity, unspecified gastritis type    New Prescriptions New Prescriptions   RANITIDINE (ZANTAC) 150 MG TABLET    Take 1 tablet (150 mg total) by mouth 2 (two) times daily.     [address] [place], [place] 01/15/17 1729    Lavera Guise, MD 01/16/17 [phone]

## 2017-01-15 NOTE — ED Triage Notes (Signed)
Pt reports chest pain since Sunday. Pt had a recent physical with her PCP and an EKG was done.  Pt's PCP has set pt up with cardiology but pt's appointment is Nov. 5th.  Pt reports her chest pain has been getting progressively worse. Pt reports the pain feels like indigestion at first but then becomes like "something weighing" on her chest.  Pt reports some SOB and is tired often.  Pt denies taking any OTC medications for the pain. Pt denies smoking. Pt a/o x 4 and ambulatory. Pt reports her chest pain is worse when she lays down.

## 2017-01-15 NOTE — ED Notes (Signed)
PA at bedside.

## 2017-01-29 ENCOUNTER — Encounter: Payer: Self-pay | Admitting: Sports Medicine

## 2017-01-29 ENCOUNTER — Ambulatory Visit (INDEPENDENT_AMBULATORY_CARE_PROVIDER_SITE_OTHER): Payer: BLUE CROSS/BLUE SHIELD | Admitting: Sports Medicine

## 2017-01-29 DIAGNOSIS — M722 Plantar fascial fibromatosis: Secondary | ICD-10-CM

## 2017-01-29 DIAGNOSIS — M79671 Pain in right foot: Secondary | ICD-10-CM

## 2017-01-29 MED ORDER — TRIAMCINOLONE ACETONIDE 40 MG/ML IJ SUSP: 20.0000 mg | Freq: Once | INTRAMUSCULAR | Status: DC

## 2017-01-29 NOTE — Patient Instructions (Signed)
For tennis shoes recommend:  Brooks Beast Ascis New balance Saucony Can be purchased at [employer] or [employer]  Vionic  SAS Can be purchased at [employer] or [employer]   For work shoes recommend: Sketchers Work Timberland boots  Can be purchased at a variety of places or [employer]   For casual shoes recommend: Vionic  Can be purchased at [employer] or [employer]  

## 2017-01-29 NOTE — Progress Notes (Signed)
Subjective: [name] is a 51 y.o. female patient return for follow up on heel pain. Reports that pain is slowly getting better; Shooting pain and throbbing that has eased off and is not constant. Pain 6/10. Tolerated Tramadol with no issues when it was needed however does not like to take pills. Denies any other pedal complaints.   Patient Active Problem List   Diagnosis Date Noted  . Palpitation 12/31/2016  . Vaccine counseling 12/31/2016  . Screen for colon cancer 12/31/2016  . Acute back pain with sciatica, left 05/10/2016  . Gastroesophageal reflux disease without esophagitis 12/30/2015  . Rhinitis, allergic 12/30/2015  . Encounter for immunization 12/30/2015  . Need for influenza vaccination 12/30/2015  . Obesity 12/30/2015  . Chronic low back pain 12/30/2015  . Screening for osteoporosis 12/30/2015  . 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 diclofenac (VOLTAREN) 75 MG EC tablet Take 1 tablet (75 mg total) by mouth 2 (two) times daily. 180 tablet 0  . meloxicam (MOBIC) 15 MG tablet Take 1 tablet (15 mg) by mouth daily as needed for pain    . Multiple Vitamins-Minerals (HM MULTIVITAMIN ADULT GUMMY PO) Take 1 tablet by mouth daily.    . ranitidine (ZANTAC) 150 MG tablet Take 1 tablet (150 mg total) by mouth 2 (two) times daily. 60 tablet 0  . traMADol (ULTRAM) 50 MG tablet Take 1 tablet (50 mg total) by mouth every 8 (eight) hours as needed. 28 tablet 0   Current Facility-Administered Medications on File Prior to Visit  Medication Dose Route Frequency Provider Last Rate Last Dose  . triamcinolone acetonide (KENALOG) 10 MG/ML injection 10 mg  10 mg Other Once Asencion IslamStover, Hugo Lybrand, DPM      . triamcinolone acetonide (KENALOG) 10 MG/ML injection 10 mg  10 mg Other Once Arroyo GardensStover, Colbi Schiltz, DPM      . triamcinolone acetonide (KENALOG-40) injection 20 mg  20 mg Other Once  Asencion IslamStover, Vasti Yagi, DPM        No Known Allergies  Objective: Physical Exam General: The patient is alert and oriented x3 in no acute distress.  Dermatology: Skin is warm, dry and supple bilateral lower extremities. Nails 1-10 are normal. There is no erythema, edema, no eccymosis, no open lesions present. Integument is otherwise unremarkable.  Vascular: Dorsalis Pedis pulse and Posterior Tibial pulse are 2/4 bilateral. Capillary fill time is immediate to all digits.  Neurological: Grossly intact to light touch with an achilles reflex of +2/5 and a negative Tinel's sign bilateral.  Musculoskeletal: Tenderness to palpation at the lateral>medial calcaneal tubercale and through the insertion of the plantar fascia on the right foot. Mild pain at insertion of achilles on right. No gap or dell. No pain with compression of calcaneus bilateral. No pain with tuning fork to calcaneus bilateral. No pain with calf compression bilateral. There is decreased Ankle joint range of motion bilateral. All other joints range of motion within normal limits bilateral. Pes planus bilateral. Strength 5/5 in all groups bilateral.   Gait: Unassisted, Antalgic avoid weight on right heel.   Assessment and Plan: Problem List Items Addressed This Visit    None    Visit Diagnoses    Plantar fasciitis of right foot    -  Primary   Relevant Medications   triamcinolone acetonide (KENALOG-40) injection 20 mg (Start on 01/29/2017  8:15 PM)   Inflammatory heel pain, right  Relevant Medications   triamcinolone acetonide (KENALOG-40) injection 20 mg (Start on 01/29/2017  8:15 PM)      -Complete examination performed.  -Previous Xrays reviewed -Re-Discussed with patient in detail the condition of plantar fasciitis, how this occurs and general treatment options. Explained both conservative and surgical treatments.  -After oral consent and aseptic prep, injected a mixture containing 1 ml of 2% plain lidocaine, 1 ml 0.5%  plain marcaine, 0.5 ml of kenalog 40 and 0.5 ml of dexamethasone phosphate into right heel at lateral band. Post-injection care discussed with patient.  -Continue with Tramadol PRN -Continue with CAM boot at work otherwise may use supportive tennis shoe with heel cushion  -Cont to ice during lunch break and at end of day with use of night splint as Rx at today's visit  -Patient to return to office in 3 weeks for follow up or sooner if problems or questions arise.  Asencion Islam, DPM

## 2017-01-30 ENCOUNTER — Other Ambulatory Visit (INDEPENDENT_AMBULATORY_CARE_PROVIDER_SITE_OTHER): Payer: Self-pay | Admitting: Orthopedic Surgery

## 2017-01-30 DIAGNOSIS — M79671 Pain in right foot: Secondary | ICD-10-CM

## 2017-01-30 DIAGNOSIS — M722 Plantar fascial fibromatosis: Secondary | ICD-10-CM

## 2017-01-30 DIAGNOSIS — M79672 Pain in left foot: Principal | ICD-10-CM

## 2017-01-30 NOTE — Telephone Encounter (Signed)
Ok to rf? 

## 2017-01-30 NOTE — Telephone Encounter (Signed)
y

## 2017-02-07 ENCOUNTER — Encounter: Payer: BLUE CROSS/BLUE SHIELD | Admitting: Gastroenterology

## 2017-02-11 ENCOUNTER — Ambulatory Visit: Payer: BLUE CROSS/BLUE SHIELD | Admitting: Cardiovascular Disease

## 2017-02-11 ENCOUNTER — Encounter: Payer: Self-pay | Admitting: Cardiovascular Disease

## 2017-02-11 VITALS — BP 108/76 | HR 73 | Ht 61.5 in | Wt 169.2 lb

## 2017-02-11 DIAGNOSIS — R002 Palpitations: Secondary | ICD-10-CM | POA: Diagnosis not present

## 2017-02-11 DIAGNOSIS — R0789 Other chest pain: Secondary | ICD-10-CM | POA: Diagnosis not present

## 2017-02-11 HISTORY — DX: Other chest pain: R07.89

## 2017-02-11 NOTE — Progress Notes (Signed)
Cardiology Office Note   Date:  02/11/2017   ID:  [name], DOB 08/27/1965, MRN [id number]  PCP:  Jac Canavanysinger, David S, PA-C  Cardiologist:   Chilton Siiffany Pine[place], MD   No chief complaint on file.    History of Present Illness: [name] is a 51 y.o. female with GERD and obesity who presents for an evaluation of chest pain and palpitations.  In September she had an episode of chest pressure that awakened her from sleeping.  It felt someone was sitting on her chest.  She had to sit up to try and catch her breath.  The pressure was 8/10 in severity and lasted for 2-3 hours.  It was associated with nausea but no diaphoresis.  She saw Dr. Aleen Campiysinger and was referred to cardiology for further evaluation.  In the interim she had another severe episode of chest pressure that lasted several hours.  It happened after the awakened from sleep and was getting ready for school.  Given the severity and persistence She presented to the emergency department.  Cardiac enzymes were negative x1.  EKG showed anterior T wave inversions.  She was noted to have epigastric tenderness to palpation, but LFTs and lipase were normal.  She reports that the pain radiated to her right arm and felt worse with stretching her arms.  It felt like very severe indigestion but did not improved with Tums.  She was discharged with instructions to follow-up with cardiology as an outpatient.  She has not experienced any lower extremity edema, orthopnea, or PND.  [name] has not experienced any lower extremity edema, orthopnea, or PND. She has also struggled with palpitations for the last 2 months.  The episodes occur 1 or 2 times per week and last for approximately 30 minutes.  They are associated with shortness of breath but no lightheadedness or dizziness.  She does not get much exercise due to plantar fasciitis and heel spurs.  She was in a boot until 2 weeks ago.  She does try to walk for exercise when  able.  She has not noted any exertional symptoms.  Past Medical History:  Diagnosis Date  . Allergy   . Bursitis of shoulder, right   . GERD (gastroesophageal reflux disease)   . Heart murmur    as a child  . Knee pain, bilateral    intermittent pain, ran track in high school; OA  . Obesity   . Routine gynecological examination    Dr. Henderson CloudHorvath  . Wisdom teeth extracted     Past Surgical History:  Procedure Laterality Date  . KNEE SURGERY  1989   tendonitis, right knee;   . TUBAL LIGATION       Current Outpatient Medications  Medication Sig Dispense Refill  . diclofenac (VOLTAREN) 75 MG EC tablet TAKE 1 TABLET BY MOUTH TWICE A DAY 180 tablet 0  . meloxicam (MOBIC) 15 MG tablet Take 1 tablet (15 mg) by mouth daily as needed for pain    . Multiple Vitamins-Minerals (HM MULTIVITAMIN ADULT GUMMY PO) Take 1 tablet by mouth daily.    . ranitidine (ZANTAC) 150 MG tablet Take 1 tablet (150 mg total) by mouth 2 (two) times daily. 60 tablet 0  . traMADol (ULTRAM) 50 MG tablet Take 1 tablet (50 mg total) by mouth every 8 (eight) hours as needed. 28 tablet 0   Current Facility-Administered Medications  Medication Dose Route Frequency Provider Last Rate Last Dose  . triamcinolone acetonide (KENALOG) 10 MG/ML  injection 10 mg  10 mg Other Once Asencion Islam, DPM      . triamcinolone acetonide (KENALOG) 10 MG/ML injection 10 mg  10 mg Other Once [place], Titorya, DPM      . triamcinolone acetonide (KENALOG-40) injection 20 mg  20 mg Other Once [place], Titorya, DPM      . triamcinolone acetonide (KENALOG-40) injection 20 mg  20 mg Other Once Asencion Islam, DPM        Allergies:   Patient has no known allergies.    Social History:  The patient  reports that  has never smoked. she has never used smokeless tobacco. She reports that she does not drink alcohol or use drugs.   Family History:  The patient's family history includes Cancer in her father; Diabetes in her mother; Fibroids in her  sister; Heart disease (age of onset: 44) in her mother; Hypertension in her father, mother, and sister; Kidney disease in her mother; Sleep apnea in her father.    ROS:  Please see the history of present illness.   Otherwise, review of systems are positive for none.   All other systems are reviewed and negative.    PHYSICAL EXAM: VS:  BP 108/76 Comment: right arm  Pulse 73 Comment: right arm  Ht 5' 1.5" (1.562 m)   Wt 76.7 kg (169 lb 3.2 oz)   BMI 31.45 kg/m  , BMI Body mass index is 31.45 kg/m. GENERAL:  Well appearing HEENT:  Pupils equal round and reactive, fundi not visualized, oral mucosa unremarkable NECK:  No jugular venous distention, waveform within normal limits, carotid upstroke brisk and symmetric, no bruits, no thyromegaly LUNGS:  Clear to auscultation bilaterally HEART:  RRR.  PMI not displaced or sustained,S1 and S2 within normal limits, no S3, no S4, no clicks, no rubs, no murmurs ABD:  Flat, positive bowel sounds normal in frequency in pitch, no bruits, no rebound, no guarding, no midline pulsatile mass, no hepatomegaly, no splenomegaly EXT:  2 plus pulses throughout, no edema, no cyanosis no clubbing SKIN:  No rashes no nodules NEURO:  Cranial nerves II through XII grossly intact, motor grossly intact throughout PSYCH:  Cognitively intact, oriented to person place and time   EKG:  EKG is ordered today. The ekg ordered today demonstrates sinus rhythm.  Rate 67 bpm.  Non-specific T wave abnormalities.    Recent Labs: 12/31/2016: TSH 2.69 01/15/2017: ALT 22; BUN 13; Creatinine, Ser 0.91; Hemoglobin 13.3; Platelets 290; Potassium 3.8; Sodium 141    Lipid Panel    Component Value Date/Time   CHOL 181 12/31/2016 1525   TRIG 124 12/31/2016 1525   HDL 75 12/31/2016 1525   CHOLHDL 2.4 12/31/2016 1525   VLDL 20 11/18/2014 0001   LDLCALC 106 11/18/2014 0001      Wt Readings from Last 3 Encounters:  02/11/17 76.7 kg (169 lb 3.2 oz)  01/15/17 77.1 kg (170 lb)    12/31/16 78.6 kg (173 lb 3.2 oz)      ASSESSMENT AND PLAN:  # Atypical chest pain: Symptoms are very atypical.  She has not had any exertional symptoms.  However, she has had anterior T wave inversions.  We will get an exercise Myoview to better assess.  Given the T wave inversions noted on EKG, the specificity of a plain treadmill stress test is suboptimal.  I suspect that her symptoms are attributable to GERD.  #Palpitations: 7-day event monitor.  All the appropriate labs in order to be tested and were unremarkable.  Current medicines are reviewed at length with the patient today.  The patient does not have concerns regarding medicines.  The following changes have been made:  no change  Labs/ tests ordered today include:  No orders of the defined types were placed in this encounter.    Disposition:   FU with Cletus Paris C. Duke Salvia, MD, [facility] in 1 month    This note was written with the assistance of speech recognition software.  Please excuse any transcriptional errors.  Signed, Ygnacio Fecteau C. Duke Salvia, MD, [facility]  02/11/2017 3:55 PM    [facility]

## 2017-02-11 NOTE — Patient Instructions (Signed)
Medication Instructions:  Your physician recommends that you continue on your current medications as directed. Please refer to the Current Medication list given to you today.  Labwork: none  Testing/Procedures: Your physician has requested that you have en exercise stress myoview. For further information please visit [email or web address]. Please follow instruction sheet, as given.  Your physician has recommended that you wear an event monitor. Event monitors are medical devices that record the heart's electrical activity. Doctors most often us these monitors to diagnose arrhythmias. Arrhythmias are problems with the speed or rhythm of the heartbeat. The monitor is a small, portable device. You can wear one while you do your normal daily activities. This is usually used to diagnose what is causing palpitations/syncope (passing out). 7 day  [facility] AT [address]  Follow-Up: Your physician recommends that you schedule a follow-up appointment in: 1 MONTH OV  If you need a refill on your cardiac medications before your next appointment, please call your pharmacy.

## 2017-02-13 ENCOUNTER — Telehealth: Payer: Self-pay | Admitting: Cardiovascular Disease

## 2017-02-13 NOTE — Telephone Encounter (Signed)
New Message ° ° pt verbalized that she is returning call for rn °

## 2017-02-13 NOTE — Telephone Encounter (Signed)
Returned call to patient and explained that I did not call her Will follow up with Nuclear department tomorrow to see if they called to go over instructions for Friday

## 2017-02-14 ENCOUNTER — Telehealth (HOSPITAL_COMMUNITY): Payer: Self-pay

## 2017-02-14 NOTE — Telephone Encounter (Signed)
Spoke with Pam in Nuclear dept and they did try to call patient yesterday to go over instructions for Friday They will follow up with patient today

## 2017-02-14 NOTE — Telephone Encounter (Signed)
Encounter complete. 

## 2017-02-15 ENCOUNTER — Ambulatory Visit (HOSPITAL_COMMUNITY)
Admission: RE | Admit: 2017-02-15 | Discharge: 2017-02-15 | Disposition: A | Discharge status: Home / Self Care | Payer: BLUE CROSS/BLUE SHIELD | Source: Ambulatory Visit | Attending: Cardiovascular Disease | Admitting: Cardiovascular Disease

## 2017-02-15 DIAGNOSIS — R0789 Other chest pain: Secondary | ICD-10-CM

## 2017-02-15 DIAGNOSIS — R002 Palpitations: Secondary | ICD-10-CM | POA: Diagnosis not present

## 2017-02-15 LAB — MYOCARDIAL PERFUSION IMAGING
CHL CUP NUCLEAR SDS: 0
CHL CUP RESTING HR STRESS: 67 {beats}/min
CSEPHR: 100 %
Estimated workload: 10.1 METS
Exercise duration (min): 8 min
Exercise duration (sec): 30 s
LVDIAVOL: 61 mL (ref 46–106)
LVSYSVOL: 21 mL
MPHR: 170 {beats}/min
Peak HR: 171 {beats}/min
RPE: 18
SRS: 1
SSS: 1
TID: 0.88

## 2017-02-15 MED ORDER — TECHNETIUM TC 99M TETROFOSMIN IV KIT
8.0000 | PACK | Freq: Once | INTRAVENOUS | Status: AC | PRN
  Administered 2017-02-15: 8 via INTRAVENOUS
  Filled 2017-02-15: qty 8

## 2017-02-15 MED ORDER — TECHNETIUM TC 99M TETROFOSMIN IV KIT
26.2000 | PACK | Freq: Once | INTRAVENOUS | Status: AC | PRN
  Administered 2017-02-15: 26.2 via INTRAVENOUS
  Filled 2017-02-15: qty 27

## 2017-02-26 ENCOUNTER — Ambulatory Visit: Payer: BLUE CROSS/BLUE SHIELD | Admitting: Sports Medicine

## 2017-02-27 ENCOUNTER — Ambulatory Visit (INDEPENDENT_AMBULATORY_CARE_PROVIDER_SITE_OTHER): Payer: BLUE CROSS/BLUE SHIELD

## 2017-02-27 DIAGNOSIS — R0789 Other chest pain: Secondary | ICD-10-CM

## 2017-02-27 DIAGNOSIS — R002 Palpitations: Secondary | ICD-10-CM | POA: Diagnosis not present

## 2017-03-05 ENCOUNTER — Telehealth: Payer: Self-pay | Admitting: Cardiovascular Disease

## 2017-03-05 ENCOUNTER — Emergency Department (HOSPITAL_COMMUNITY)
Admission: EM | Admit: 2017-03-05 | Discharge: 2017-03-05 | Disposition: A | Discharge status: Home / Self Care | Payer: BLUE CROSS/BLUE SHIELD | Attending: Emergency Medicine | Admitting: Emergency Medicine

## 2017-03-05 ENCOUNTER — Emergency Department (HOSPITAL_COMMUNITY): Payer: BLUE CROSS/BLUE SHIELD

## 2017-03-05 ENCOUNTER — Encounter (HOSPITAL_COMMUNITY): Payer: Self-pay | Admitting: Emergency Medicine

## 2017-03-05 DIAGNOSIS — R0789 Other chest pain: Secondary | ICD-10-CM | POA: Insufficient documentation

## 2017-03-05 DIAGNOSIS — R079 Chest pain, unspecified: Secondary | ICD-10-CM | POA: Diagnosis not present

## 2017-03-05 DIAGNOSIS — Z79899 Other long term (current) drug therapy: Secondary | ICD-10-CM | POA: Insufficient documentation

## 2017-03-05 LAB — BASIC METABOLIC PANEL
Anion gap: 7 (ref 5–15)
BUN: 19 mg/dL (ref 6–20)
CHLORIDE: 104 mmol/L (ref 101–111)
CO2: 25 mmol/L (ref 22–32)
Calcium: 9.4 mg/dL (ref 8.9–10.3)
Creatinine, Ser: 1.17 mg/dL — ABNORMAL HIGH (ref 0.44–1.00)
GFR calc Af Amer: >60 mL/min (ref >60)
GFR calc non Af Amer: 53 mL/min — ABNORMAL LOW (ref >60)
GLUCOSE: 101 mg/dL — AB (ref 65–99)
POTASSIUM: 3.8 mmol/L (ref 3.5–5.1)
Sodium: 136 mmol/L (ref 135–145)

## 2017-03-05 LAB — CBC
HEMATOCRIT: 38.4 % (ref 36.0–46.0)
Hemoglobin: 12.5 g/dL (ref 12.0–15.0)
MCH: 29.6 pg (ref 26.0–34.0)
MCHC: 32.6 g/dL (ref 30.0–36.0)
MCV: 90.8 fL (ref 78.0–100.0)
Platelets: 304 10*3/uL (ref 150–400)
RBC: 4.23 MIL/uL (ref 3.87–5.11)
RDW: 13.6 % (ref 11.5–15.5)
WBC: 5.8 10*3/uL (ref 4.0–10.5)

## 2017-03-05 LAB — I-STAT BETA HCG BLOOD, ED (MC, WL, AP ONLY): I-stat hCG, quantitative: <5 m[IU]/mL (ref <5)

## 2017-03-05 LAB — I-STAT TROPONIN, ED
Troponin i, poc: 0 ng/mL (ref 0.00–0.08)
Troponin i, poc: 0 ng/mL (ref 0.00–0.08)

## 2017-03-05 MED ORDER — RANITIDINE HCL 150 MG PO CAPS: 150.0000 mg | ORAL_CAPSULE | Freq: Every day | ORAL | 0 refills | Status: DC

## 2017-03-05 NOTE — Telephone Encounter (Signed)
New message   Patient states she woke up this morning to  chest pain, back pain and SOB and lightheaded.  Pt c/o of Chest Pain: STAT if CP now or developed within 24 hours  1. Are you having CP right now? YES  2. Are you experiencing any other symptoms (ex. SOB, nausea, vomiting, sweating)? SOB  3. How long have you been experiencing CP? Started about 5:30am  4. Is your CP continuous or coming and going? continuous 5. Have you taken Nitroglycerin? NO ?

## 2017-03-05 NOTE — Telephone Encounter (Signed)
Pt of Dr. Duke Salviaandolph, currently wearing heart monitor to assess symptoms of palpitations and atypical chest pain. She called this AM reporting continuous, unresolved chest pain and midsternal pressure since 5:30am. States this pain is different than in the past - reporting radiating pain to her right shoulder blade. She is also experiencing shortness of breath.  I advised [facility] and to have either a driver or ambulance to take her.  Pt voiced understanding and agreed to go to hospital for evaluation.  Routed to Dr. Duke Salviaandolph as Lorain ChildesFYI

## 2017-03-05 NOTE — Discharge Instructions (Signed)
Please follow-up with cardiology to review the holter monitor results. Your labs and ECG are normal today, with the exception of a mildly increased creatinine. Please increase your fluid intake. Consider restarting the zantac.

## 2017-03-05 NOTE — ED Triage Notes (Signed)
Patient here from home with complaints of central chest pain radiating into back that started 5am this morning. Denies nausea/vomiting. Currently wearing heart monitor. Followed by cardiology.

## 2017-03-05 NOTE — ED Provider Notes (Signed)
[facility]-EMERGENCY DEPT Provider Note   CSN: [id number] Arrival date & time: 03/05/17  1522     History   Chief Complaint Chief Complaint  Patient presents with  . Chest Pain    HPI [name] is a 51 y.o. female.  Patient recently seen by cardiology and is currently wearing a holter monitor. She has been having intermittent chest pain for months. Recent cardiac stress test (02/15/17) negative for ischemia/infarction.   The history is provided by the patient.  Chest Pain   This is a recurrent problem. The current episode started 6 to 12 hours ago. The problem occurs daily. The problem has not changed since onset.The pain is present in the substernal region. The pain is mild. The quality of the pain is described as heavy. The pain does not radiate. Associated symptoms include diaphoresis and shortness of breath.    Past Medical History:  Diagnosis Date  . Allergy   . Atypical chest pain 02/11/2017  . Bursitis of shoulder, right   . GERD (gastroesophageal reflux disease)   . Heart murmur    as a child  . Knee pain, bilateral    intermittent pain, ran track in high school; OA  . Obesity   . Routine gynecological examination    Dr. Henderson CloudHorvath  . Wisdom teeth extracted     Patient Active Problem List   Diagnosis Date Noted  . Atypical chest pain 02/11/2017  . Palpitation 12/31/2016  . Vaccine counseling 12/31/2016  . Screen for colon cancer 12/31/2016  . Acute back pain with sciatica, left 05/10/2016  . Gastroesophageal reflux disease without esophagitis 12/30/2015  . Rhinitis, allergic 12/30/2015  . Encounter for immunization 12/30/2015  . Need for influenza vaccination 12/30/2015  . Obesity 12/30/2015  . Chronic low back pain 12/30/2015  . Screening for osteoporosis 12/30/2015  . 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 KNEE ARTHROSCOPY WITH  DRILLING/MICROFRACTURE Left 10/06/2013   Procedure: KNEE ARTHROSCOPY WITH DRILLING/MICROFRACTURE AND ALLOGRAFT CARTLAGE IMPLANTATION, CHONDRAPLASTY OF TROCHIAL AND PATELLA;  Surgeon: Cammy CopaGregory Scott Dean, MD;  Location: [facility] OR;  Service: Orthopedics;  Laterality: Left;  . KNEE SURGERY  1989   tendonitis, right knee;   . TUBAL LIGATION      OB History    No data available       Home Medications    Prior to Admission medications   Medication Sig Start Date End Date Taking? Authorizing Provider  diclofenac (VOLTAREN) 75 MG EC tablet TAKE 1 TABLET BY MOUTH TWICE A DAY 01/31/17  Yes Dean, Corrie MckusickGregory Scott, MD  meloxicam (MOBIC) 15 MG tablet Take 1 tablet (15 mg) by mouth daily as needed for pain   Yes [provider]  Multiple Vitamins-Minerals (HM MULTIVITAMIN ADULT GUMMY PO) Take 1 tablet by mouth daily.   Yes [provider]  ranitidine (ZANTAC) 150 MG tablet Take 1 tablet (150 mg total) by mouth 2 (two) times daily. 01/15/17  Yes Street, [place], PA-C  traMADol (ULTRAM) 50 MG tablet Take 1 tablet (50 mg total) by mouth every 8 (eight) hours as needed. 01/08/17  Yes Asencion IslamStover, Titorya, DPM    Family History Family History  Problem Relation Age of Onset  . Hypertension Mother   . Kidney disease Mother        hypertensive kidney failure, renal transplant  . Diabetes Mother   . Heart disease Mother [id number]  CHF  . Heart failure Mother   . Stroke Mother   . Hypertension Father   . Sleep apnea Father   . Cancer Father        leukemia  . Fibroids Sister   . Hypertension Sister     Social History Social History   Tobacco Use  . Smoking status: Never Smoker  . Smokeless tobacco: Never Used  Substance Use Topics  . Alcohol use: No  . Drug use: No     Allergies   Patient has no known allergies.   Review of Systems Review of Systems  Constitutional: Positive for diaphoresis.  Respiratory: Positive for shortness of breath.   Cardiovascular: Positive for chest  pain.  All other systems reviewed and are negative.    Physical Exam Updated Vital Signs BP 104/78 (BP Location: Left Arm)   Pulse 77   Temp 98 F (36.7 C) (Oral)   Resp 16   SpO2 100%   Physical Exam  Constitutional: She is oriented to person, place, and time. She appears well-developed and well-nourished. She does not appear ill.  HENT:  Head: Atraumatic.  Eyes: Conjunctivae are normal.  Neck: Neck supple.  Cardiovascular: Normal rate, regular rhythm and normal pulses.  Pulmonary/Chest: Effort normal and breath sounds normal.  Abdominal: Soft. Bowel sounds are normal.  Musculoskeletal: Normal range of motion. She exhibits no edema.       Right lower leg: She exhibits no edema.       Left lower leg: She exhibits no edema.  Neurological: She is alert and oriented to person, place, and time.  Skin: Skin is warm and dry.  Psychiatric: She has a normal mood and affect.  Nursing note and vitals reviewed.    ED Treatments / Results  Labs (all labs ordered are listed, but only abnormal results are displayed) Labs Reviewed  BASIC METABOLIC PANEL - Abnormal; Notable for the following components:      Result Value   Glucose, Bld 101 (*)    Creatinine, Ser 1.17 (*)    GFR calc non Af Amer 53 (*)    All other components within normal limits  CBC  I-STAT TROPONIN, ED  I-STAT BETA HCG BLOOD, ED ([facility], [facility], [facility] ONLY)  I-STAT TROPONIN, ED    EKG  EKG Interpretation  Date/Time:  Tuesday March 05 2017 15:30:44 EST Ventricular Rate:  75 PR Interval:    QRS Duration: 92 QT Interval:  371 QTC Calculation: 415 R Axis:   -12 Text Interpretation:  Sinus rhythm Probable anterior infarct, age indeterminate since last tracing no significant change Confirmed by Mancel BaleWentz, Elliott [phone]([id number]) on 03/05/2017 3:36:43 PM Also confirmed by Mancel BaleWentz, Elliott [phone]), editor Barbette Hairassel, Kerry [phone]([id number])  on 03/05/2017 4:03:20 PM       Radiology Dg Chest 2 View  Result Date: 03/05/2017 CLINICAL DATA:   Chest pain. EXAM: CHEST  2 VIEW COMPARISON:  01/15/2017 FINDINGS: Cardiomediastinal silhouette is normal. Mediastinal contours appear intact. There is no evidence of focal airspace consolidation, pleural effusion or pneumothorax. Osseous structures are without acute abnormality. Soft tissues are grossly normal. IMPRESSION: No active cardiopulmonary disease. Electronically Signed   By: Ted Mcalpineobrinka  Dimitrova M.D.   On: 03/05/2017 16:04    Procedures Procedures (including critical care time)  Medications Ordered in ED Medications - No data to display   Initial Impression / Assessment and Plan / ED Course  I have reviewed the triage vital signs and the nursing notes.  Pertinent labs & imaging results that were available  during my care of the patient were reviewed by me and considered in my medical decision making (see chart for details).     Patient is to be discharged with recommendation to follow up with PCP in regards to today's hospital visit. Chest pain is not likely of cardiac or pulmonary etiology d/t presentation, perc negative, VSS, no tracheal deviation, no JVD or new murmur, RRR, breath sounds equal bilaterally, EKG without acute abnormalities, negative troponin, and negative CXR. Pt has been advised start a PPI and return to the ED is CP becomes exertional, associated with diaphoresis or nausea, radiates to left jaw/arm, worsens or becomes concerning in any way. Pt appears reliable for follow up and is agreeable to discharge. Patient to follow-up with her cardiologist  Case has been discussed with Dr. Effie Shy, who agrees with the above plan to discharge.   Final Clinical Impressions(s) / ED Diagnoses   Final diagnoses:  Atypical chest pain    ED Discharge Orders        Ordered    ranitidine (ZANTAC) 150 MG capsule  Daily     03/05/17 1929       Felicie Morn, NP 03/06/17 0128    Mancel Bale, MD 03/07/17 [phone]

## 2017-03-11 ENCOUNTER — Encounter: Payer: Self-pay | Admitting: Medical

## 2017-03-11 ENCOUNTER — Ambulatory Visit: Payer: BLUE CROSS/BLUE SHIELD | Admitting: Medical

## 2017-03-11 VITALS — BP 100/62 | HR 68 | Wt 168.2 lb

## 2017-03-11 DIAGNOSIS — R109 Unspecified abdominal pain: Secondary | ICD-10-CM | POA: Diagnosis not present

## 2017-03-11 DIAGNOSIS — M545 Low back pain: Secondary | ICD-10-CM | POA: Diagnosis not present

## 2017-03-11 DIAGNOSIS — R102 Pelvic and perineal pain: Secondary | ICD-10-CM

## 2017-03-11 LAB — BASIC METABOLIC PANEL
BUN: 15 mg/dL (ref 7–25)
CO2: 30 mmol/L (ref 20–32)
Calcium: 10.1 mg/dL (ref 8.6–10.4)
Chloride: 102 mmol/L (ref 98–110)
Creat: 0.98 mg/dL (ref 0.50–1.05)
Glucose, Bld: 86 mg/dL (ref 65–99)
Potassium: 4.4 mmol/L (ref 3.5–5.3)
Sodium: 138 mmol/L (ref 135–146)

## 2017-03-11 LAB — POCT URINALYSIS DIP (PROADVANTAGE DEVICE)
Bilirubin, UA: NEGATIVE
Blood, UA: NEGATIVE
Glucose, UA: NEGATIVE mg/dL
Ketones, POC UA: NEGATIVE mg/dL
Nitrite, UA: NEGATIVE
Protein Ur, POC: NEGATIVE mg/dL
Specific Gravity, Urine: 1.03
Urobilinogen, Ur: NEGATIVE
pH, UA: 6 (ref 5.0–8.0)

## 2017-03-11 MED ORDER — HYDROCODONE-ACETAMINOPHEN 7.5-325 MG PO TABS: 1.0000 | ORAL_TABLET | Freq: Four times a day (QID) | ORAL | 0 refills | Status: DC | PRN

## 2017-03-11 NOTE — Progress Notes (Signed)
Subjective:  [name] is a 51 y.o. female who presents for ED f/u.  She presents today for emergency department follow-up.  She was seen 03/05/17 for chest pain.  A cardiac source was ruled out, however she notes in the last few days she has had increasing bandlike pain around her abdomen and back and less pain in the chest.  She was concerned about her gallbladder.  She denies fever, vomiting, constipation, blood in the stool, no UTI symptoms no blood in the urine.   She denies history of kidney stone, no history of recurrent urinary infections.  No weight loss, no night sweats, no other complaints.  [name] does have a history of kidney disease  No other aggravating or relieving factors.    No other c/o.  The following portions of the patient's history were reviewed and updated as appropriate: allergies, current medications, past family history, past medical history, past social history, past surgical history and problem list.  ROS Otherwise as in subjective above  Past Medical History:  Diagnosis Date  . Allergy   . Atypical chest pain 02/11/2017  . Bursitis of shoulder, right   . GERD (gastroesophageal reflux disease)   . Heart murmur    as a child  . Knee pain, bilateral    intermittent pain, ran track in high school; OA  . Obesity   . Routine gynecological examination    [name]  . Wisdom teeth extracted    Current Outpatient Medications on File Prior to Visit  Medication Sig Dispense Refill  . meloxicam (MOBIC) 15 MG tablet Take 1 tablet (15 mg) by mouth daily as needed for pain    . Multiple Vitamins-Minerals (HM MULTIVITAMIN ADULT GUMMY PO) Take 1 tablet by mouth daily.    . ranitidine (ZANTAC) 150 MG capsule Take 1 capsule (150 mg total) by mouth daily. 30 capsule 0   Current Facility-Administered Medications on File Prior to Visit  Medication Dose Route Frequency Provider Last Rate Last Dose  . triamcinolone acetonide (KENALOG) 10 MG/ML injection 10  mg  10 mg Other Once Asencion IslamStover, Titorya, DPM      . triamcinolone acetonide (KENALOG) 10 MG/ML injection 10 mg  10 mg Other Once [place]Hi[place]ver, Titorya, DPM      . triamcinolone acetonide (KENALOG-40) injection 20 mg  20 mg Other Once AlexanderStover, Titorya, DPM      . triamcinolone acetonide (KENALOG-40) injection 20 mg  20 mg Other Once Asencion IslamStover, Titorya, DPM         Objective: BP 100/62   Pulse 68   Wt 168 lb 3.2 oz (76.3 kg)   SpO2 98%   BMI 31.78 kg/m   General appearance: alert, no distress, WD/WN Oral cavity: MMM, no lesions Neck: supple, no lymphadenopathy, no thyromegaly, no masses Heart: RRR, normal S1, S2, no murmurs Lungs: CTA bilaterally, no wheezes, rhonchi, or rales Abdomen: +bs, soft, tender across the lower abdomen, particularly right lower quadrant and suprapubic area, but also tender in the central abdomen, no right upper quadrant tenderness otherwise non tender, non distended, no masses, no hepatomegaly, no splenomegaly back tender along the right flank otherwise non tender we discussed her symptoms, Pulses: 2+ radial pulses, 2+ pedal pulses, normal cap refill Ext: no edema   Assessment: Encounter Diagnoses  Name Primary?  . Stomach pain Yes  . Low back pain, unspecified back pain laterality, unspecified chronicity, with sciatica presence unspecified   . Abdominal pain, unspecified abdominal location   . Pelvic pain  Plan: Reviewed recent emergency department report, labs and imaging.  We discussed possible differential including renal stone, pyelonephritis, colitis or other issue.  We will seen here for culture, lab today, and pursue CT scan.  She can use the Norco as needed for pain, hydrate well.  We discussed symptoms of appendicitis or  kidney stone that would warrant urgent recheck.  [name] was seen today for hospitalization follow-up.  Diagnoses and all orders for this visit:  Stomach pain -     POCT Urinalysis DIP (Proadvantage Device) -     Urine Culture -      Basic metabolic panel  Low back pain, unspecified back pain laterality, unspecified chronicity, with sciatica presence unspecified -     Urine Culture -     Basic metabolic panel  Abdominal pain, unspecified abdominal location -     Urine Culture -     Basic metabolic panel -     CT ABDOMEN PELVIS W CONTRAST; Future  Pelvic pain  Other orders -     HYDROcodone-acetaminophen (NORCO) 7.5-325 MG tablet; Take 1 tablet by mouth every 6 (six) hours as needed for moderate pain.   Follow up: pending imaging and labs

## 2017-03-12 LAB — URINE CULTURE
MICRO NUMBER:: 81355222
SPECIMEN QUALITY:: ADEQUATE

## 2017-03-13 ENCOUNTER — Encounter: Payer: Self-pay | Admitting: Cardiovascular Disease

## 2017-03-13 ENCOUNTER — Inpatient Hospital Stay: Admission: RE | Admit: 2017-03-13 | Payer: BLUE CROSS/BLUE SHIELD | Source: Ambulatory Visit

## 2017-03-13 ENCOUNTER — Ambulatory Visit: Payer: BLUE CROSS/BLUE SHIELD | Admitting: Cardiovascular Disease

## 2017-03-13 VITALS — BP 122/77 | HR 83 | Ht 61.5 in | Wt 171.8 lb

## 2017-03-13 DIAGNOSIS — K219 Gastro-esophageal reflux disease without esophagitis: Secondary | ICD-10-CM

## 2017-03-13 DIAGNOSIS — R0789 Other chest pain: Secondary | ICD-10-CM

## 2017-03-13 MED ORDER — PANTOPRAZOLE SODIUM 40 MG PO TBEC: 40.0000 mg | DELAYED_RELEASE_TABLET | Freq: Two times a day (BID) | ORAL | 11 refills | Status: DC

## 2017-03-13 NOTE — Patient Instructions (Signed)
Medication Instructions:  START PANTOPRAZOLE 40 MG TWICE A DAY   Labwork: NONE  Testing/Procedures: NONE  Follow-Up: AS NEEDED   Any Other Special Instructions Will Be Listed Below (If Applicable).

## 2017-03-13 NOTE — Progress Notes (Signed)
Cardiology Office Note   Date:  02/11/2017   ID:  [name], DOB 08/08/1965, MRN [id number]  PCP:  Jac Canavanysinger, David S, PA-C  Cardiologist:   Chilton Siiffany Rockingham, MD   Chief complaint: Chest pain   History of Present Illness: [name] is a 51 y.o. female with GERD and obesity who presents for follow up.  She was initially seen 02/2017 for an evaluation of chest pain and palpitations.  In September she had an episode of chest pressure that awakened her from sleeping.  It felt like someone was sitting on her chest.  She had to sit up to try and catch her breath.  The pressure was 8/10 in severity and lasted for 2-3 hours.  She was referred for an exercise Myoview 02/15/17 that revealed LVEF 65% and was negative for ischemia.  She also had a 7 day event monitor that showed no arrhythmias.  She reported feeling chest pain, palpitations and shortness of breath while wearing the monitor.  Since that testing she continues to have intermittent chest pain that typically occurs when lying down.  She also gets some discomfort after eating.  She wonders if it could be related to her gallbladder.  She hasn't experienced any lower extremity edema, orthopnea or PND.     Past Medical History:  Diagnosis Date  . Allergy   . Bursitis of shoulder, right   . GERD (gastroesophageal reflux disease)   . Heart murmur    as a child  . Knee pain, bilateral    intermittent pain, ran track in high school; OA  . Obesity   . Routine gynecological examination    Dr. Henderson CloudHorvath  . Wisdom teeth extracted     Past Surgical History:  Procedure Laterality Date  . KNEE SURGERY  1989   tendonitis, right knee;   . TUBAL LIGATION       Current Outpatient Medications  Medication Sig Dispense Refill  . diclofenac (VOLTAREN) 75 MG EC tablet TAKE 1 TABLET BY MOUTH TWICE A DAY 180 tablet 0  . meloxicam (MOBIC) 15 MG tablet Take 1 tablet (15 mg) by mouth daily as needed for pain    . Multiple  Vitamins-Minerals (HM MULTIVITAMIN ADULT GUMMY PO) Take 1 tablet by mouth daily.    . ranitidine (ZANTAC) 150 MG tablet Take 1 tablet (150 mg total) by mouth 2 (two) times daily. 60 tablet 0  . traMADol (ULTRAM) 50 MG tablet Take 1 tablet (50 mg total) by mouth every 8 (eight) hours as needed. 28 tablet 0   Current Facility-Administered Medications  Medication Dose Route Frequency Provider Last Rate Last Dose  . triamcinolone acetonide (KENALOG) 10 MG/ML injection 10 mg  10 mg Other Once Asencion IslamStover, Titorya, DPM      . triamcinolone acetonide (KENALOG) 10 MG/ML injection 10 mg  10 mg Other Once MonroeStover, Titorya, DPM      . triamcinolone acetonide (KENALOG-40) injection 20 mg  20 mg Other Once CrowleyStover, Titorya, DPM      . triamcinolone acetonide (KENALOG-40) injection 20 mg  20 mg Other Once Asencion IslamStover, Titorya, DPM        Allergies:   Patient has no known allergies.    Social History:  The patient  reports that  has never smoked. she has never used smokeless tobacco. She reports that she does not drink alcohol or use drugs.   Family History:  The patient's family history includes Cancer in her father; Diabetes in her mother; Fibroids in  her sister; Heart disease (age of onset: 5367) in her mother; Hypertension in her father, mother, and sister; Kidney disease in her mother; Sleep apnea in her father.    ROS:  Please see the history of present illness.   Otherwise, review of systems are positive for none.   All other systems are reviewed and negative.    PHYSICAL EXAM: VS:  BP 122/77   Pulse 83   Ht 5' 1.5" (1.562 m)   Wt 171 lb 12.8 oz (77.9 kg)   SpO2 100%   BMI 31.94 kg/m  , BMI Body mass index is 31.94 kg/m. GENERAL:  Well appearing HEENT: Pupils equal round and reactive, fundi not visualized, oral mucosa unremarkable NECK:  No jugular venous distention, waveform within normal limits, carotid upstroke brisk and symmetric, no bruits, no thyromegaly LUNGS:  Clear to auscultation  bilaterally HEART:  RRR.  PMI not displaced or sustained,S1 and S2 within normal limits, no S3, no S4, no clicks, no rubs, no murmurs ABD:  Flat, positive bowel sounds normal in frequency in pitch, no bruits, no rebound, no guarding, no midline pulsatile mass, no hepatomegaly, no splenomegaly EXT:  2 plus pulses throughout, no edema, no cyanosis no clubbing SKIN:  No rashes no nodules NEURO:  Cranial nerves II through XII grossly intact, motor grossly intact throughout PSYCH:  Cognitively intact, oriented to person place and time   EKG:  EKG is ordered today. The ekg ordered 02/11/17 demonstrates sinus rhythm.  Rate 67 bpm.  Non-specific T wave abnormalities.   Exercise Myoview 02/15/17:  Nuclear stress EF: 65%. The left ventricular ejection fraction is normal (55-65%).  The study is normal.  This is a low risk study.  There is no evidence of ischemia . No evidence of infarction   Recent Labs: 12/31/2016: TSH 2.69 01/15/2017: ALT 22; BUN 13; Creatinine, Ser 0.91; Hemoglobin 13.3; Platelets 290; Potassium 3.8; Sodium 141    Lipid Panel    Component Value Date/Time   CHOL 181 12/31/2016 1525   TRIG 124 12/31/2016 1525   HDL 75 12/31/2016 1525   CHOLHDL 2.4 12/31/2016 1525   VLDL 20 11/18/2014 0001   LDLCALC 106 11/18/2014 0001      Wt Readings from Last 3 Encounters:  02/11/17 76.7 kg (169 lb 3.2 oz)  01/15/17 77.1 kg (170 lb)  12/31/16 78.6 kg (173 lb 3.2 oz)      ASSESSMENT AND PLAN:  # Atypical chest pain: Symptoms are very atypical.  Lexiscan Myoview was negative for ischemia.  Her symptoms seems to be attributable to acid reflux.  She will try pantoprazole 40mg  bid and follow up with her PCP.   #Palpitations: Event monitor unremarkable.    Current medicines are reviewed at length with the patient today.  The patient does not have concerns regarding medicines.  The following changes have been made:  no change  Labs/ tests ordered today include:  No orders of  the defined types were placed in this encounter.    Disposition:   FU with Monae Topping C. Duke Salviaandolph, MD, [facility] as needed.    This note was written with the assistance of speech recognition software.  Please excuse any transcriptional errors.  Signed, Myra Weng C. Duke Salviaandolph, MD, [facility]  02/11/2017 3:55 PM    [facility]

## 2017-03-18 ENCOUNTER — Encounter: Payer: Self-pay | Admitting: Cardiovascular Disease

## 2017-03-18 ENCOUNTER — Other Ambulatory Visit: Payer: BLUE CROSS/BLUE SHIELD

## 2017-04-09 HISTORY — PX: FACET JOINT INJECTION: SHX5016

## 2017-04-09 HISTORY — PX: OTHER SURGICAL HISTORY: SHX169

## 2017-04-15 ENCOUNTER — Telehealth (INDEPENDENT_AMBULATORY_CARE_PROVIDER_SITE_OTHER): Payer: Self-pay | Admitting: Physical Medicine and Rehabilitation

## 2017-04-15 NOTE — Telephone Encounter (Signed)
Scheduled for 04/23/17 at 1530 with driver.

## 2017-04-15 NOTE — Telephone Encounter (Signed)
ok 

## 2017-04-23 ENCOUNTER — Ambulatory Visit (INDEPENDENT_AMBULATORY_CARE_PROVIDER_SITE_OTHER): Payer: BLUE CROSS/BLUE SHIELD | Admitting: Physical Medicine and Rehabilitation

## 2017-04-23 ENCOUNTER — Ambulatory Visit (INDEPENDENT_AMBULATORY_CARE_PROVIDER_SITE_OTHER): Payer: BLUE CROSS/BLUE SHIELD

## 2017-04-23 VITALS — BP 105/65 | HR 69 | Temp 97.5°F | Resp 98

## 2017-04-23 DIAGNOSIS — M47816 Spondylosis without myelopathy or radiculopathy, lumbar region: Secondary | ICD-10-CM

## 2017-04-23 MED ORDER — METHYLPREDNISOLONE ACETATE 80 MG/ML IJ SUSP
80.0000 mg | Freq: Once | INTRAMUSCULAR | Status: AC
  Administered 2017-04-23: 80 mg

## 2017-04-23 NOTE — Progress Notes (Deleted)
Patient here today with low back pain, states the pain isn't going down her legs "this time around". 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

## 2017-04-23 NOTE — Patient Instructions (Signed)

## 2017-05-03 NOTE — Procedures (Signed)
Mrs. Lenna Sciaraurner-Cuthrell is a 52 year old female with mild facet arthropathy particularly at L4-5 the lumbar spine. We saw her June and March and completed diagnostic and therapeutic left L4-5 facet joint block and she did extremely well. She says she was pain-free for several weeks and then gradual increase in pain. She still better than she was initially but the pain started to return she gets a stabbing pain at times. She says when she is standing it'll radiate down the left leg to the mid thigh which is consistent with an L4-5 facet joint. We'll repeat the injection today but will do bilateral facets for bilateral pain and we talked about activity modification and the likelihood of this helping over time.  Lumbar Facet Joint Intra-Articular Injection(s) with Fluoroscopic Guidance  Patient: Yalonda T Turner-Cuthrell      Date of Birth: 10/20/1965 MRN: [id number] PCP: Jac Canavanysinger, David S, PA-C      Visit Date: 04/23/2017   Universal Protocol:    Date/Time: 04/23/2017  Consent Given By: the patient  Position: PRONE   Additional Comments: Vital signs were monitored before and after the procedure. Patient was prepped and draped in the usual sterile fashion. The correct patient, procedure, and site was verified.   Injection Procedure Details:  Procedure Site One Meds Administered:  Meds ordered this encounter  Medications  . methylPREDNISolone acetate (DEPO-MEDROL) injection 80 mg     Laterality: Bilateral  Location/Site:  L4-L5  Needle size: 22 guage  Needle type: Spinal  Needle Placement: Articular  Findings:  -Comments: Excellent flow of contrast producing a partial arthrogram.  Procedure Details: The fluoroscope beam is vertically oriented in AP, and the inferior recess is visualized beneath the lower pole of the inferior apophyseal process, which represents the target point for needle insertion. When direct visualization is difficult the target point is located at the medial  projection of the vertebral pedicle. The region overlying each aforementioned target is locally anesthetized with a 1 to 2 ml. volume of 1% Lidocaine without Epinephrine.   The spinal needle was inserted into each of the above mentioned facet joints using biplanar fluoroscopic guidance. A 0.25 to 0.5 ml. volume of Isovue-250 was injected and a partial facet joint arthrogram was obtained. A single spot film was obtained of the resulting arthrogram.    One to 1.25 ml of the steroid/anesthetic solution was then injected into each of the facet joints noted above.   Additional Comments:  The patient tolerated the procedure well Dressing: Band-Aid    Post-procedure details: Patient was observed during the procedure. Post-procedure instructions were reviewed.  Patient left the clinic in stable condition.  Pertinent Imaging: IMPRESSION: 1. Mild multilevel lumbar disc degeneration without spinal stenosis. 2. Mild neural foraminal stenosis on the left at L3-4 and bilaterally at L4-5. 3. Mild-to-moderate facet arthrosis, greatest at L4-5.   Electronically Signed   By: Sebastian AcheAllen  Grady M.D.   On: 05/22/2016 17:29

## 2017-05-22 DIAGNOSIS — Z01419 Encounter for gynecological examination (general) (routine) without abnormal findings: Secondary | ICD-10-CM | POA: Diagnosis not present

## 2017-05-22 DIAGNOSIS — Z124 Encounter for screening for malignant neoplasm of cervix: Secondary | ICD-10-CM | POA: Diagnosis not present

## 2017-05-22 DIAGNOSIS — Z1231 Encounter for screening mammogram for malignant neoplasm of breast: Secondary | ICD-10-CM | POA: Diagnosis not present

## 2017-05-22 DIAGNOSIS — Z6831 Body mass index (BMI) 31.0-31.9, adult: Secondary | ICD-10-CM | POA: Diagnosis not present

## 2017-05-22 LAB — HM PAP SMEAR: HM Pap smear: NEGATIVE

## 2017-05-23 LAB — HM MAMMOGRAPHY: HM Mammogram: ABNORMAL — AB (ref 0–4)

## 2017-05-24 LAB — HM PAP SMEAR: HM Pap smear: NEGATIVE

## 2017-05-24 LAB — RESULTS CONSOLE HPV: CHL HPV: NEGATIVE

## 2017-06-03 ENCOUNTER — Telehealth (INDEPENDENT_AMBULATORY_CARE_PROVIDER_SITE_OTHER): Payer: Self-pay

## 2017-06-03 MED ORDER — DICLOFENAC SODIUM 75 MG PO TBEC: 75.0000 mg | DELAYED_RELEASE_TABLET | Freq: Two times a day (BID) | ORAL | 0 refills | Status: DC

## 2017-06-03 NOTE — Telephone Encounter (Signed)
Patient request refill on diclofenac 75mg  #[address] [facility] [address]

## 2017-06-03 NOTE — Addendum Note (Signed)
Addended byPrescott Parma: Vito Beg on: 06/03/2017 05:03 PM   Modules accepted: Orders

## 2017-06-03 NOTE — Telephone Encounter (Signed)
Faxed to pharmacy

## 2017-06-03 NOTE — Telephone Encounter (Signed)
y

## 2017-07-04 ENCOUNTER — Telehealth (INDEPENDENT_AMBULATORY_CARE_PROVIDER_SITE_OTHER): Payer: Self-pay | Admitting: Physical Medicine and Rehabilitation

## 2017-07-04 NOTE — Telephone Encounter (Signed)
OV to talk about radiofrequency ablation.  Try to see when the last time she had physical therapy or chiropractic care was.  He can tell her were looking at that visit might be something we have to have for the procedure but we can discuss this.

## 2017-07-05 NOTE — Telephone Encounter (Signed)
Left message for patient to call back to schedule.  °

## 2017-07-09 ENCOUNTER — Telehealth (INDEPENDENT_AMBULATORY_CARE_PROVIDER_SITE_OTHER): Payer: Self-pay | Admitting: *Deleted

## 2017-07-09 NOTE — Telephone Encounter (Signed)
Ok will talk to her then

## 2017-07-09 NOTE — Telephone Encounter (Signed)
Please advise, thx

## 2017-07-10 NOTE — Telephone Encounter (Signed)
IC LM advising.  ?

## 2017-07-10 NOTE — Telephone Encounter (Signed)
Needs rov 

## 2017-07-11 NOTE — Telephone Encounter (Signed)
Did you end up speaking to this patient?

## 2017-07-11 NOTE — Telephone Encounter (Signed)
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

## 2017-08-06 ENCOUNTER — Encounter (INDEPENDENT_AMBULATORY_CARE_PROVIDER_SITE_OTHER): Payer: Self-pay | Admitting: Physical Medicine and Rehabilitation

## 2017-08-06 ENCOUNTER — Ambulatory Visit (INDEPENDENT_AMBULATORY_CARE_PROVIDER_SITE_OTHER): Payer: BLUE CROSS/BLUE SHIELD | Admitting: Physical Medicine and Rehabilitation

## 2017-08-06 ENCOUNTER — Other Ambulatory Visit (INDEPENDENT_AMBULATORY_CARE_PROVIDER_SITE_OTHER): Payer: Self-pay | Admitting: Physical Medicine and Rehabilitation

## 2017-08-06 VITALS — BP 117/70 | HR 74 | Temp 98.0°F

## 2017-08-06 DIAGNOSIS — M545 Low back pain: Secondary | ICD-10-CM

## 2017-08-06 DIAGNOSIS — M47816 Spondylosis without myelopathy or radiculopathy, lumbar region: Secondary | ICD-10-CM | POA: Diagnosis not present

## 2017-08-06 DIAGNOSIS — G8929 Other chronic pain: Secondary | ICD-10-CM

## 2017-08-06 NOTE — Progress Notes (Signed)
.  Numeric Pain Rating Scale and Functional Assessment Average Pain 10 Pain Right Now 8 My pain is constant, sharp and stabbing Pain is worse with: walking, sitting, standing and some activites Pain improves with: therapy/exercise   In the last MONTH (on 0-10 scale) has pain interfered with the following?  1. General activity like being  able to carry out your everyday physical activities such as walking, climbing stairs, carrying groceries, or moving a chair?  Rating(8)  2. Relation with others like being able to carry out your usual social activities and roles such as  activities at home, at work and in your community. Rating(8)  3. Enjoyment of life such that you have  been bothered by emotional problems such as feeling anxious, depressed or irritable?  Rating(3)

## 2017-08-13 DIAGNOSIS — M545 Low back pain: Secondary | ICD-10-CM | POA: Diagnosis not present

## 2017-08-19 ENCOUNTER — Encounter (INDEPENDENT_AMBULATORY_CARE_PROVIDER_SITE_OTHER): Payer: Self-pay | Admitting: Physical Medicine and Rehabilitation

## 2017-08-19 NOTE — Progress Notes (Signed)
[name] - 52 y.o. female MRN [id number]  Date of birth: October 23, 1965  Office Visit Note: Visit Date: 08/06/2017 PCP: Jac Canavan, PA-C Referred by: Jac Canavan, PA-C  Subjective: Chief Complaint  Patient presents with  . Lower Back - Pain   HPI: [name] is a pleasant 52 year old female who typically sees Dr. August Saucer in our office for orthopedic care whom I have seen in the past on a few occasions for her low back pain.  She gets axial low back pain worse with standing.  Is worse left than right.  Is worse on exam with facet joint loading left more than right with extension rotation.  She gets from referral into the left thigh but it does not go past the knee and there is no paresthesias in this consistent with a facet mediated referral pattern.  MRI evidence shows mild to moderate facet arthropathy without nerve compression or stenosis.  She has had some therapy in the past but not recently.  We have completed double diagnostic facet joint blocks with good relief of her symptoms temporarily and these are documented.  She has had no new trauma.  No fevers chills or night sweats or focal weakness.  No radicular pain but her symptoms are better at rest and sitting.  She does report increased symptoms that she sits for prolonged.  She gets a lot of pain when she first goes from standing from sitting or from squatting to coming up in extension.   Review of Systems  Constitutional: Negative for chills, fever, malaise/fatigue and weight loss.  HENT: Negative for hearing loss and sinus pain.   Eyes: Negative for blurred vision, double vision and photophobia.  Respiratory: Negative for cough and shortness of breath.   Cardiovascular: Negative for chest pain, palpitations and leg swelling.  Gastrointestinal: Negative for abdominal pain, nausea and vomiting.  Genitourinary: Negative for flank pain.  Musculoskeletal: Positive for back pain. Negative for myalgias.    Skin: Negative for itching and rash.  Neurological: Negative for tremors, focal weakness and weakness.  Endo/Heme/Allergies: Negative.   Psychiatric/Behavioral: Negative for depression.  All other systems reviewed and are negative.  Otherwise per HPI.  Assessment & Plan: Visit Diagnoses:  1. Spondylosis without myelopathy or radiculopathy, lumbar region   2. Chronic bilateral low back pain without sciatica     Plan: Findings:  Chronic worsening axial low back pain bilaterally but left more than right.  Some referral in the left hip and thigh without paresthesia and does not go past the knee.  Is consistent with facet joint mediated type pain.  MRI shows facet arthropathy and some degenerative changes but without focal stenosis or disc herniation.  She has done well with double diagnostic facet joint blocks.  She is gotten almost 100% relief with these procedures for temporary amount of time.  These are documented.  We are going to send her to a course of physical therapy which she has not had recently.  She does have some level of myofascial pain as well.  Depending on relief with physical therapy we would look at radiofrequency ablation of the facet joints.  She has had this ongoing for many years.  It is worsening.  It is consistent with facet mediated pain.  She has had anti-inflammatory and pain medication without much relief.    Meds & Orders: No orders of the defined types were placed in this encounter.  No orders of the defined types were placed in this encounter.  Follow-up: Return if symptoms worsen or fail to improve, for Consider radiofrequency ablation.   Procedures: No procedures performed  No notes on file   Clinical History: IMPRESSION: 1. Mild multilevel lumbar disc degeneration without spinal stenosis. 2. Mild neural foraminal stenosis on the left at L3-4 and bilaterally at L4-5. 3. Mild-to-moderate facet arthrosis, greatest at L4-5.   Electronically Signed    By: Sebastian Ache M.D.   On: 05/22/2016 17:29   She reports that she has never smoked. She has never used smokeless tobacco.  Recent Labs    12/31/16 1525  HGBA1C 5.3    Objective:  VS:  HT:    WT:   BMI:     BP:117/70  HR:74bpm  TEMP:98 F (36.7 C)(Oral)  RESP:98 % Physical Exam  Constitutional: She is oriented to person, place, and time. She appears well-developed and well-nourished.  Eyes: Pupils are equal, round, and reactive to light. Conjunctivae and EOM are normal.  Cardiovascular: Normal rate and intact distal pulses.  Pulmonary/Chest: Effort normal.  Musculoskeletal:  Patient goes from sit to stand with some difficulty upon extension.  She has concordant pain left more than right with facet joint loading of extension and rotation.  She has no pain over the greater trochanters.  She has no pain with hip rotation.  She has tightness in the paraspinal musculature.  She has good distal strength.  She ambulates without aid.  Neurological: She is alert and oriented to person, place, and time. She exhibits normal muscle tone. Coordination normal.  Skin: Skin is warm and dry. No rash noted. No erythema.  Psychiatric: She has a normal mood and affect. Her behavior is normal.  Nursing note and vitals reviewed.   Ortho Exam Imaging: No results found.  Past Medical/Family/Surgical/Social History: Medications & Allergies reviewed per EMR, new medications updated. Patient Active Problem List   Diagnosis Date Noted  . Atypical chest pain 02/11/2017  . Palpitation 12/31/2016  . Vaccine counseling 12/31/2016  . Screen for colon cancer 12/31/2016  . Acute back pain with sciatica, left 05/10/2016  . Gastroesophageal reflux disease without esophagitis 12/30/2015  . Rhinitis, allergic 12/30/2015  . Encounter for immunization 12/30/2015  . Need for influenza vaccination 12/30/2015  . Obesity 12/30/2015  . Chronic low back pain 12/30/2015  . Screening for osteoporosis 12/30/2015  .  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 Allergy   . Atypical chest pain 02/11/2017  . Bursitis of shoulder, right   . GERD (gastroesophageal reflux disease)   . Heart murmur    as a child  . Knee pain, bilateral    intermittent pain, ran track in high school; OA  . Obesity   . Routine gynecological examination    Dr. Henderson Cloud  . Wisdom teeth extracted    Family History  Problem Relation Age of Onset  . Hypertension Mother   . Kidney disease Mother        hypertensive kidney failure, renal transplant  . Diabetes Mother   . Heart disease Mother 39       CHF  . Heart failure Mother   . Stroke Mother   . Hypertension Father   . Sleep apnea Father   . Cancer Father        leukemia  . Fibroids Sister   . Hypertension Sister    Past Surgical History:  Procedure Laterality Date  . KNEE ARTHROSCOPY WITH DRILLING/MICROFRACTURE Left 10/06/2013   Procedure:  KNEE ARTHROSCOPY WITH DRILLING/MICROFRACTURE AND ALLOGRAFT CARTLAGE IMPLANTATION, CHONDRAPLASTY OF TROCHIAL AND PATELLA;  Surgeon: Cammy Copa, MD;  Location: [facility] OR;  Service: Orthopedics;  Laterality: Left;  . KNEE SURGERY  1989   tendonitis, right knee;   . TUBAL LIGATION     Social History   Occupational History  . Not on file  Tobacco Use  . Smoking status: Never Smoker  . Smokeless tobacco: Never Used  Substance and Sexual Activity  . Alcohol use: No  . Drug use: No  . Sexual activity: Not on file

## 2017-08-21 DIAGNOSIS — M545 Low back pain: Secondary | ICD-10-CM | POA: Diagnosis not present

## 2017-08-28 DIAGNOSIS — M545 Low back pain: Secondary | ICD-10-CM | POA: Diagnosis not present

## 2017-09-09 ENCOUNTER — Telehealth (INDEPENDENT_AMBULATORY_CARE_PROVIDER_SITE_OTHER): Payer: Self-pay | Admitting: *Deleted

## 2017-09-17 DIAGNOSIS — M5416 Radiculopathy, lumbar region: Secondary | ICD-10-CM | POA: Diagnosis not present

## 2017-10-02 ENCOUNTER — Telehealth (INDEPENDENT_AMBULATORY_CARE_PROVIDER_SITE_OTHER): Payer: Self-pay | Admitting: *Deleted

## 2017-10-21 ENCOUNTER — Other Ambulatory Visit: Payer: Self-pay | Admitting: Student

## 2017-10-21 DIAGNOSIS — M5416 Radiculopathy, lumbar region: Secondary | ICD-10-CM

## 2017-10-25 NOTE — Telephone Encounter (Signed)
error 

## 2017-11-05 ENCOUNTER — Ambulatory Visit
Admission: RE | Admit: 2017-11-05 | Discharge: 2017-11-05 | Disposition: A | Discharge status: Home / Self Care | Payer: BLUE CROSS/BLUE SHIELD | Source: Ambulatory Visit | Attending: Student | Admitting: Student

## 2017-11-05 DIAGNOSIS — M5416 Radiculopathy, lumbar region: Secondary | ICD-10-CM

## 2017-11-05 DIAGNOSIS — M48061 Spinal stenosis, lumbar region without neurogenic claudication: Secondary | ICD-10-CM | POA: Diagnosis not present

## 2017-11-07 ENCOUNTER — Telehealth (INDEPENDENT_AMBULATORY_CARE_PROVIDER_SITE_OTHER): Payer: Self-pay | Admitting: *Deleted

## 2017-11-07 DIAGNOSIS — Z1211 Encounter for screening for malignant neoplasm of colon: Secondary | ICD-10-CM | POA: Diagnosis not present

## 2017-11-07 DIAGNOSIS — E669 Obesity, unspecified: Secondary | ICD-10-CM | POA: Diagnosis not present

## 2017-11-07 NOTE — Telephone Encounter (Signed)
We could try Lumbar RFA (ablation,cooking) of the small nerves of the joints for longer lasting relief. This would hopefully help the back pain longer

## 2017-11-07 NOTE — Telephone Encounter (Signed)
Spoke with pt she states that she will bring in PT notes and would like to proceed with RFA.

## 2017-11-12 DIAGNOSIS — M545 Low back pain: Secondary | ICD-10-CM | POA: Diagnosis not present

## 2017-11-12 DIAGNOSIS — Z6826 Body mass index (BMI) 26.0-26.9, adult: Secondary | ICD-10-CM | POA: Diagnosis not present

## 2017-11-12 DIAGNOSIS — G8929 Other chronic pain: Secondary | ICD-10-CM | POA: Diagnosis not present

## 2017-11-13 ENCOUNTER — Other Ambulatory Visit: Payer: Self-pay | Admitting: Medical

## 2017-11-14 NOTE — Telephone Encounter (Signed)
Is this ok to refill?  

## 2017-11-20 ENCOUNTER — Other Ambulatory Visit: Payer: Self-pay | Admitting: Student

## 2017-11-20 DIAGNOSIS — G8929 Other chronic pain: Secondary | ICD-10-CM

## 2017-11-20 DIAGNOSIS — M545 Low back pain, unspecified: Secondary | ICD-10-CM

## 2017-11-29 ENCOUNTER — Other Ambulatory Visit: Payer: Self-pay | Admitting: Student

## 2017-11-29 ENCOUNTER — Ambulatory Visit
Admission: RE | Admit: 2017-11-29 | Discharge: 2017-11-29 | Disposition: A | Discharge status: Home / Self Care | Payer: BLUE CROSS/BLUE SHIELD | Source: Ambulatory Visit | Attending: Student | Admitting: Student

## 2017-11-29 DIAGNOSIS — G8929 Other chronic pain: Secondary | ICD-10-CM

## 2017-11-29 DIAGNOSIS — M545 Low back pain: Principal | ICD-10-CM

## 2017-11-29 MED ORDER — METHYLPREDNISOLONE ACETATE 40 MG/ML INJ SUSP (RADIOLOG
120.0000 mg | Freq: Once | INTRAMUSCULAR | Status: AC
  Administered 2017-11-29: 120 mg via INTRA_ARTICULAR

## 2017-11-29 MED ORDER — IOPAMIDOL (ISOVUE-M 200) INJECTION 41%
1.0000 mL | Freq: Once | INTRAMUSCULAR | Status: AC
  Administered 2017-11-29: 1 mL via INTRA_ARTICULAR

## 2017-11-29 NOTE — Discharge Instructions (Signed)

## 2017-12-05 NOTE — Telephone Encounter (Signed)
Called pt and she states she will still turn in PT, but cannot go with procedure due to pt being in school.

## 2018-01-01 ENCOUNTER — Encounter: Payer: Self-pay | Admitting: Medical

## 2018-01-01 ENCOUNTER — Ambulatory Visit: Payer: BLUE CROSS/BLUE SHIELD | Admitting: Medical

## 2018-01-01 VITALS — BP 120/70 | HR 73 | Temp 97.6°F | Resp 16 | Ht 61.0 in | Wt 166.0 lb

## 2018-01-01 DIAGNOSIS — Z7185 Encounter for immunization safety counseling: Secondary | ICD-10-CM

## 2018-01-01 DIAGNOSIS — Z7189 Other specified counseling: Secondary | ICD-10-CM | POA: Diagnosis not present

## 2018-01-01 DIAGNOSIS — Z23 Encounter for immunization: Secondary | ICD-10-CM | POA: Diagnosis not present

## 2018-01-01 DIAGNOSIS — Z0000 Encounter for general adult medical examination without abnormal findings: Secondary | ICD-10-CM

## 2018-01-01 LAB — POCT URINALYSIS DIP (PROADVANTAGE DEVICE)
Bilirubin, UA: NEGATIVE
Glucose, UA: NEGATIVE mg/dL
Ketones, POC UA: NEGATIVE mg/dL
LEUKOCYTES UA: NEGATIVE
NITRITE UA: NEGATIVE
Protein Ur, POC: NEGATIVE mg/dL
Specific Gravity, Urine: 1.03
UUROB: NEGATIVE
pH, UA: 5 (ref 5.0–8.0)

## 2018-01-01 NOTE — Patient Instructions (Signed)
Thanks for trusting [place] with your health care and for coming in for a physical today.  Below are some general recommendations I have for you:  Yearly screenings See your eye doctor yearly for routine vision care. See your dentist yearly for routine dental care including hygiene visits twice yearly. See me here yearly for a routine physical and preventative care visit    Specific Concerns today:  . Shingles vaccine:  I recommend you have a shingles vaccine to help prevent shingles or herpes zoster outbreak.   Please call your insurer to inquire about coverage for the Shingrix vaccine given in 2 doses.   Some insurers cover this vaccine after age 82, some cover this after age 16.  If your insurer covers this, then call to schedule appointment to have this vaccine here. . Get me info on your last Tetanus booster vaccine   Please follow up yearly for a physical.    I have included other useful information below for your review.  Preventative Care for Adults - Female      MAINTAIN REGULAR HEALTH EXAMS:  A routine yearly physical is a good way to check in with your primary care provider about your health and preventive screening. It is also an opportunity to share updates about your health and any concerns you have, and receive a thorough all-over exam.   Most health insurance companies pay for at least some preventative services.  Check with your health plan for specific coverages.  WHAT PREVENTATIVE SERVICES DO WOMEN NEED?  Adult women should have their weight and blood pressure checked regularly.   Women age 46 and older should have their cholesterol levels checked regularly.  Women should be screened for cervical cancer with a Pap smear and pelvic exam beginning at either age 40, or 3 years after they become sexually activity.    Breast cancer screening generally begins at age 88 with a mammogram and breast exam by your primary care provider.    Beginning at age 60 and continuing  to age 54, women should be screened for colorectal cancer.  Certain people may need continued testing until age 9.  Updating vaccinations is part of preventative care.  Vaccinations help protect against diseases such as the flu.  Osteoporosis is a disease in which the bones lose minerals and strength as we age. Women ages 26 and over should discuss this with their caregivers, as should women after menopause who have other risk factors.  Lab tests are generally done as part of preventative care to screen for anemia and blood disorders, to screen for problems with the kidneys and liver, to screen for bladder problems, to check blood sugar, and to check your cholesterol level.  Preventative services generally include counseling about diet, exercise, avoiding tobacco, drugs, excessive alcohol consumption, and sexually transmitted infections.    GENERAL RECOMMENDATIONS FOR GOOD HEALTH:  Healthy diet:  Eat a variety of foods, including fruit, vegetables, animal or vegetable protein, such as meat, fish, chicken, and eggs, or beans, lentils, tofu, and grains, such as rice.  Drink plenty of water daily.  Decrease saturated fat in the diet, avoid lots of red meat, processed foods, sweets, fast foods, and fried foods.  Exercise:  Aerobic exercise helps maintain good heart health. At least 30-40 minutes of moderate-intensity exercise is recommended. For example, a brisk walk that increases your heart rate and breathing. This should be done on most days of the week.   Find a type of exercise or a variety  of exercises that you enjoy so that it becomes a part of your daily life.  Examples are running, walking, swimming, water aerobics, and biking.  For motivation and support, explore group exercise such as aerobic class, spin class, Zumba, Yoga,or  martial arts, etc.    Set exercise goals for yourself, such as a certain weight goal, walk or run in a race such as a 5k walk/run.  Speak to your primary care  provider about exercise goals.  Disease prevention:  If you smoke or chew tobacco, find out from your caregiver how to quit. It can literally save your life, no matter how long you have been a tobacco user. If you do not use tobacco, never begin.   Maintain a healthy diet and normal weight. Increased weight leads to problems with blood pressure and diabetes.   The Body Mass Index or BMI is a way of measuring how much of your body is fat. Having a BMI above 27 increases the risk of heart disease, diabetes, hypertension, stroke and other problems related to obesity. Your caregiver can help determine your BMI and based on it develop an exercise and dietary program to help you achieve or maintain this important measurement at a healthful level.  High blood pressure causes heart and blood vessel problems.  Persistent high blood pressure should be treated with medicine if weight loss and exercise do not work.   Fat and cholesterol leaves deposits in your arteries that can block them. This causes heart disease and vessel disease elsewhere in your body.  If your cholesterol is found to be high, or if you have heart disease or certain other medical conditions, then you may need to have your cholesterol monitored frequently and be treated with medication.   Ask if you should have a cardiac stress test if your history suggests this. A stress test is a test done on a treadmill that looks for heart disease. This test can find disease prior to there being a problem.  Menopause can be associated with physical symptoms and risks. Hormone replacement therapy is available to decrease these. You should talk to your caregiver about whether starting or continuing to take hormones is right for you.   Osteoporosis is a disease in which the bones lose minerals and strength as we age. This can result in serious bone fractures. Risk of osteoporosis can be identified using a bone density scan. Women ages 60 and over should  discuss this with their caregivers, as should women after menopause who have other risk factors. Ask your caregiver whether you should be taking a calcium supplement and Vitamin D, to reduce the rate of osteoporosis.   Avoid drinking alcohol in excess (more than two drinks per day).  Avoid use of street drugs. Do not share needles with anyone. Ask for professional help if you need assistance or instructions on stopping the use of alcohol, cigarettes, and/or drugs.  Brush your teeth twice a day with fluoride toothpaste, and floss once a day. Good oral hygiene prevents tooth decay and gum disease. The problems can be painful, unattractive, and can cause other health problems. Visit your dentist for a routine oral and dental check up and preventive care every 6-12 months.   Look at your skin regularly.  Use a mirror to look at your back. Notify your caregivers of changes in moles, especially if there are changes in shapes, colors, a size larger than a pencil eraser, an irregular border, or development of new moles.  Safety:  Use seatbelts 100% of the time, whether driving or as a passenger.  Use safety devices such as hearing protection if you work in environments with loud noise or significant background noise.  Use safety glasses when doing any work that could send debris in to the eyes.  Use a helmet if you ride a bike or motorcycle.  Use appropriate safety gear for contact sports.  Talk to your caregiver about gun safety.  Use sunscreen with a SPF (or skin protection factor) of 15 or greater.  Lighter skinned people are at a greater risk of skin cancer. Don't forget to also wear sunglasses in order to protect your eyes from too much damaging sunlight. Damaging sunlight can accelerate cataract formation.   Practice safe sex. Use condoms. Condoms are used for birth control and to help reduce the spread of sexually transmitted infections (or STIs).  Some of the STIs are gonorrhea (the clap), chlamydia,  syphilis, trichomonas, herpes, HPV (human papilloma virus) and HIV (human immunodeficiency virus) which causes AIDS. The herpes, HIV and HPV are viral illnesses that have no cure. These can result in disability, cancer and death.   Keep carbon monoxide and smoke detectors in your home functioning at all times. Change the batteries every 6 months or use a model that plugs into the wall.   Vaccinations:  Stay up to date with your tetanus shots and other required immunizations. You should have a booster for tetanus every 10 years. Be sure to get your flu shot every year, since 5%-20% of the [place] population comes down with the flu. The flu vaccine changes each year, so being vaccinated once is not enough. Get your shot in the fall, before the flu season peaks.   Other vaccines to consider:  Human Papilloma Virus or HPV causes cancer of the cervix, and other infections that can be transmitted from person to person. There is a vaccine for HPV, and females should get immunized between the ages of 5 and 63. It requires a series of 3 shots.   Pneumococcal vaccine to protect against certain types of pneumonia.  This is normally recommended for adults age 63 or older.  However, adults younger than 51 years old with certain underlying conditions such as diabetes, heart or lung disease should also receive the vaccine.  Shingles vaccine to protect against Varicella Zoster if you are older than age 37, or younger than 51 years old with certain underlying illness.  If you have not had the Shingrix vaccine, please call your insurer to inquire about coverage for the Shingrix vaccine given in 2 doses.   Some insurers cover this vaccine after age 45, some cover this after age 22.  If your insurer covers this, then call to schedule appointment to have this vaccine here  Hepatitis A vaccine to protect against a form of infection of the liver by a virus acquired from food.  Hepatitis B vaccine to protect against a form  of infection of the liver by a virus acquired from blood or body fluids, particularly if you work in health care.  If you plan to travel internationally, check with your local health department for specific vaccination recommendations.  Cancer Screening:  Breast cancer screening is essential to preventive care for women. All women age 61 and older should perform a breast self-exam every month. At age 38 and older, women should have their caregiver complete a breast exam each year. Women at ages 14 and older should have a mammogram (x-ray film) of  the breasts. Your caregiver can discuss how often you need mammograms.    Cervical cancer screening includes taking a Pap smear (sample of cells examined under a microscope) from the cervix (end of the uterus). It also includes testing for HPV (Human Papilloma Virus, which can cause cervical cancer). Screening and a pelvic exam should begin at age 14, or 3 years after a woman becomes sexually active. Screening should occur every year, with a Pap smear but no HPV testing, up to age 80. After age 20, you should have a Pap smear every 3 years with HPV testing, if no HPV was found previously.   Most routine colon cancer screening begins at the age of 32. On a yearly basis, doctors may provide special easy to use take-home tests to check for hidden blood in the stool. Sigmoidoscopy or colonoscopy can detect the earliest forms of colon cancer and is life saving. These tests use a small camera at the end of a tube to directly examine the colon. Speak to your caregiver about this at age 70, when routine screening begins (and is repeated every 5 years unless early forms of pre-cancerous polyps or small growths are found).

## 2018-01-01 NOTE — Progress Notes (Signed)
Subjective:   HPI  [name] is a 52 y.o. female who presents for a complete physical.  Medical care team includes: [facility] Layal Javid, [name], PA-C here for primary care   Ended up having facet injections through orthopedics and finally got her back pains improved since last year.  Reviewed their medical, surgical, family, social, medication, and allergy history and updated chart as appropriate.  Past Medical History:  Diagnosis Date  . Allergy   . Atypical chest pain 02/11/2017  . Bursitis of shoulder, right   . GERD (gastroesophageal reflux disease)   . Heart murmur    as a child  . Knee pain, bilateral    intermittent pain, ran track in high school; OA  . Obesity   . Routine gynecological examination    Dr. Henderson Cloud  . Wisdom teeth extracted     Past Surgical History:  Procedure Laterality Date  . epidural steroid injection, lumbar spine  2019  . FACET JOINT INJECTION  2019  . KNEE ARTHROSCOPY WITH DRILLING/MICROFRACTURE Left 10/06/2013   Procedure: KNEE ARTHROSCOPY WITH DRILLING/MICROFRACTURE AND ALLOGRAFT CARTLAGE IMPLANTATION, CHONDRAPLASTY OF TROCHIAL AND PATELLA;  Surgeon: Cammy Copa, MD;  Location: [facility] OR;  Service: Orthopedics;  Laterality: Left;  . KNEE SURGERY  1989   tendonitis, right knee;   . TUBAL LIGATION      Social History   Socioeconomic History  . Marital status: Married    Spouse name: Not on file  . Number of children: Not on file  . Years of education: Not on file  . Highest education level: Not on file  Occupational History  . Not on file  Social Needs  . Financial resource strain: Not on file  . Food insecurity:    Worry: Not on file    Inability: Not on file  . Transportation needs:    Medical: Not on file    Non-medical: Not on file  Tobacco Use  . Smoking status: Never Smoker  . Smokeless tobacco: Never Used  Substance and Sexual Activity  . Alcohol use: No  . Drug use: No  .  Sexual activity: Not on file  Lifestyle  . Physical activity:    Days per week: Not on file    Minutes per session: Not on file  . Stress: Not on file  Relationships  . Social connections:    Talks on phone: Not on file    Gets together: Not on file    Attends religious service: Not on file    Active member of club or organization: Not on file    Attends meetings of clubs or organizations: Not on file    Relationship status: Not on file  . Intimate partner violence:    Fear of current or ex partner: Not on file    Emotionally abused: Not on file    Physically abused: Not on file    Forced sexual activity: Not on file  Other Topics Concern  . 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  Hobbies - walking, makes jewelry, likes adult coloring books.  12/2016    Family History  Problem Relation Age of Onset  . Hypertension Mother   . Kidney disease Mother        hypertensive kidney failure, renal transplant  .  Diabetes Mother   . Heart disease Mother 44       CHF  . Heart failure Mother   . Stroke Mother   . Hypertension Father   . Sleep apnea Father   . Cancer Father        leukemia  . Fibroids Sister   . Hypertension Sister      Current Outpatient Medications:  Marland Kitchen  Multiple Vitamins-Minerals (HM MULTIVITAMIN ADULT GUMMY PO), Take 1 tablet by mouth daily., Disp: , Rfl:  .  diclofenac (VOLTAREN) 75 MG EC tablet, Take 1 tablet (75 mg total) by mouth 2 (two) times daily. (Patient not taking: Reported on 01/01/2018), Disp: 180 tablet, Rfl: 0 .  HYDROcodone-acetaminophen (NORCO) 7.5-325 MG tablet, Take 1 tablet by mouth every 6 (six) hours as needed for moderate pain. (Patient not taking: Reported on 01/01/2018), Disp: 15 tablet, Rfl: 0 .  meloxicam (MOBIC) 15 MG tablet, Take  1 tablet (15 mg) by mouth daily as needed for pain, Disp: , Rfl:  .  pantoprazole (PROTONIX) 40 MG tablet, Take 1 tablet (40 mg total) by mouth 2 (two) times daily. (Patient not taking: Reported on 01/01/2018), Disp: 60 tablet, Rfl: 11 .  ranitidine (ZANTAC) 150 MG capsule, Take 1 capsule (150 mg total) by mouth daily. (Patient not taking: Reported on 01/01/2018), Disp: 30 capsule, Rfl: 0  Current Facility-Administered Medications:  .  triamcinolone acetonide (KENALOG) 10 MG/ML injection 10 mg, 10 mg, Other, Once, Stover, Titorya, DPM .  triamcinolone acetonide (KENALOG) 10 MG/ML injection 10 mg, 10 mg, Other, Once, Stover, Titorya, DPM .  triamcinolone acetonide (KENALOG-40) injection 20 mg, 20 mg, Other, Once, Stover, Titorya, DPM .  triamcinolone acetonide (KENALOG-40) injection 20 mg, 20 mg, Other, Once, Stover, Titorya, DPM  No Known Allergies   Review of Systems Constitutional: -fever, -chills, -sweats, -unexpected weight change, -decreased appetite, -fatigue Allergy: -sneezing, -itching, -congestion Dermatology: -changing moles, -rash, -lumps ENT: -runny nose, -ear pain, -sore throat, -hoarseness, -sinus pain, -teeth pain, - ringing in ears, -hearing loss, -nosebleeds Cardiology: -chest pain, -palpitations, -swelling, -difficulty breathing when lying flat, -waking up short of breath Respiratory: -cough, -shortness of breath, -difficulty breathing with exercise or exertion, -wheezing, -coughing up blood Gastroenterology: -abdominal pain, -nausea, -vomiting, -diarrhea, -constipation, -blood in stool, -changes in bowel movement, -difficulty swallowing or eating Hematology: -bleeding, -bruising  Musculoskeletal: -joint aches, -muscle aches, -joint swelling, -back pain, -neck pain, -cramping, -changes in gait Ophthalmology: denies vision changes, eye redness, itching, discharge Urology: -burning with urination, -difficulty urinating, -blood in urine, -urinary frequency, -urgency,  -incontinence Neurology: -headache, -weakness, -tingling, -numbness, -memory loss, -falls, -dizziness Psychology: -depressed mood, -agitation, -sleep problems     Objective:   Physical Exam  BP 120/70   Pulse 73   Temp 97.6 F (36.4 C) (Oral)   Resp 16   Ht 5\' 1"  (1.549 m)   Wt 166 lb (75.3 kg)   SpO2 97%   BMI 31.37 kg/m   Wt Readings from Last 3 Encounters:  01/01/18 166 lb (75.3 kg)  03/13/17 171 lb 12.8 oz (77.9 kg)  03/11/17 168 lb 3.2 oz (76.3 kg)    General appearance: alert, no distress, WD/WN, AA female Skin: no worrisome lesions HEENT: normocephalic, conjunctiva/corneas normal, sclerae anicteric, PERRLA, EOMi, nares patent, no discharge or erythema, pharynx normal Oral cavity: MMM, tongue normal, teeth - few areas of decay, otherwise in good repair Neck: supple, no lymphadenopathy, no thyromegaly, no masses, normal ROM, no bruits Chest: non tender, normal shape and expansion Heart: RRR, normal  S1, S2, no murmurs Lungs: CTA bilaterally, no wheezes, rhonchi, or rales Abdomen: +bs, soft, non tender, non distended, no masses, no hepatomegaly, no splenomegaly, no bruits Back: non tender, normal ROM, no scoliosis Musculoskeletal: upper extremities non tender, no obvious deformity, normal ROM throughout, lower extremities non tender, no obvious deformity, normal ROM throughout Extremities: no edema, no cyanosis, no clubbing Pulses: 2+ symmetric, upper and lower extremities, normal cap refill Neurological: alert, oriented x 3, CN2-12 intact, strength normal upper extremities and lower extremities, sensation normal throughout, DTRs 2+ throughout, no cerebellar signs, gait normal Psychiatric: normal affect, behavior normal, pleasant  Breast/gyn/rectal - deferred to gyn   Assessment and Plan :    Encounter Diagnoses  Name Primary?  . Routine general medical examination at a health care facility Yes  . Need for influenza vaccination   . Vaccine counseling    Physical  exam - discussed healthy lifestyle, diet, exercise, preventative care, vaccinations, and addressed their concerns.  Handout given.  See your eye doctor yearly for routine vision care. See your dentist yearly for routine dental care including hygiene visits twice yearly. See your gynecologist yearly for routine gynecological care.  Continue plan for Colonoscopy 01/15/18  Counseled on the influenza virus vaccine.  Vaccine information sheet given.  Influenza vaccine given after consent obtained.  Follow-up pending labs   [name] was seen today for cpe.  Diagnoses and all orders for this visit:  Routine general medical examination at a health care facility -     POCT Urinalysis DIP (Proadvantage Device) -     Comprehensive metabolic panel -     CBC -     Lipid panel -     TSH  Need for influenza vaccination -     Flu Vaccine QUAD 6+ mos PF IM (Fluarix Quad PF)  Vaccine counseling

## 2018-01-02 LAB — COMPREHENSIVE METABOLIC PANEL
ALBUMIN: 4.4 g/dL (ref 3.5–5.5)
ALT: 21 IU/L (ref 0–32)
AST: 22 IU/L (ref 0–40)
Albumin/Globulin Ratio: 1.3 (ref 1.2–2.2)
Alkaline Phosphatase: 65 IU/L (ref 39–117)
BUN/Creatinine Ratio: 16 (ref 9–23)
BUN: 15 mg/dL (ref 6–24)
CALCIUM: 10 mg/dL (ref 8.7–10.2)
CHLORIDE: 103 mmol/L (ref 96–106)
CO2: 24 mmol/L (ref 20–29)
Creatinine, Ser: 0.92 mg/dL (ref 0.57–1.00)
GFR, EST AFRICAN AMERICAN: 83 mL/min/{1.73_m2} (ref >59)
GFR, EST NON AFRICAN AMERICAN: 72 mL/min/{1.73_m2} (ref >59)
GLOBULIN, TOTAL: 3.3 g/dL (ref 1.5–4.5)
Glucose: 87 mg/dL (ref 65–99)
POTASSIUM: 4 mmol/L (ref 3.5–5.2)
Sodium: 141 mmol/L (ref 134–144)
TOTAL PROTEIN: 7.7 g/dL (ref 6.0–8.5)

## 2018-01-02 LAB — CBC
HEMOGLOBIN: 12.5 g/dL (ref 11.1–15.9)
Hematocrit: 38.1 % (ref 34.0–46.6)
MCH: 28.9 pg (ref 26.6–33.0)
MCHC: 32.8 g/dL (ref 31.5–35.7)
MCV: 88 fL (ref 79–97)
Platelets: 314 10*3/uL (ref 150–450)
RBC: 4.33 x10E6/uL (ref 3.77–5.28)
RDW: 13 % (ref 12.3–15.4)
WBC: 6.3 10*3/uL (ref 3.4–10.8)

## 2018-01-02 LAB — LIPID PANEL
CHOLESTEROL TOTAL: 235 mg/dL — AB (ref 100–199)
Chol/HDL Ratio: 2.9 ratio (ref 0.0–4.4)
HDL: 80 mg/dL (ref >39)
LDL CALC: 125 mg/dL — AB (ref 0–99)
TRIGLYCERIDES: 152 mg/dL — AB (ref 0–149)
VLDL Cholesterol Cal: 30 mg/dL (ref 5–40)

## 2018-01-02 LAB — TSH: TSH: 1.23 u[IU]/mL (ref 0.450–4.500)

## 2018-01-09 NOTE — Telephone Encounter (Signed)
Pt has not responded and has not turned in PT notes to office.

## 2018-01-21 ENCOUNTER — Telehealth: Payer: Self-pay | Admitting: Medical

## 2018-01-21 NOTE — Telephone Encounter (Signed)
Abstract and scan

## 2018-01-21 NOTE — Telephone Encounter (Signed)
Received requested pap and mammogram from [facility].Sending back for review.

## 2018-01-23 ENCOUNTER — Encounter: Payer: Self-pay | Admitting: Internal Medicine

## 2018-01-23 NOTE — Telephone Encounter (Signed)
done

## 2018-02-17 DIAGNOSIS — Z1211 Encounter for screening for malignant neoplasm of colon: Secondary | ICD-10-CM | POA: Diagnosis not present

## 2018-02-17 LAB — HM COLONOSCOPY

## 2018-04-07 ENCOUNTER — Other Ambulatory Visit: Payer: BLUE CROSS/BLUE SHIELD

## 2018-04-14 ENCOUNTER — Other Ambulatory Visit: Payer: Self-pay

## 2018-04-14 DIAGNOSIS — N952 Postmenopausal atrophic vaginitis: Secondary | ICD-10-CM | POA: Insufficient documentation

## 2018-04-15 ENCOUNTER — Ambulatory Visit: Payer: BLUE CROSS/BLUE SHIELD | Admitting: Sports Medicine

## 2018-04-15 ENCOUNTER — Encounter: Payer: Self-pay | Admitting: Sports Medicine

## 2018-04-15 DIAGNOSIS — M79671 Pain in right foot: Secondary | ICD-10-CM

## 2018-04-15 DIAGNOSIS — M779 Enthesopathy, unspecified: Secondary | ICD-10-CM | POA: Diagnosis not present

## 2018-04-15 MED ORDER — METHYLPREDNISOLONE 4 MG PO TBPK: ORAL_TABLET | ORAL | 0 refills | Status: DC

## 2018-04-15 MED ORDER — TRIAMCINOLONE ACETONIDE 40 MG/ML IJ SUSP
20.0000 mg | Freq: Once | INTRAMUSCULAR | Status: AC
  Administered 2018-04-15: 20 mg

## 2018-04-15 NOTE — Progress Notes (Signed)
Subjective: [name] is a 53 y.o. female patient who presents to office for evaluation of right foot pain. Patient complains of progressive pain especially over the last few weeks at the lateral side of the heel that radiates up towards the ankle.  Patient reports that during the wintertime she is consistently wearing her plantar fascial braces and heel lifts but is not sure what happened over the last few weeks does state that she has been wearing a little bit more of the holidays and since she has been out of school has been experiencing more pain patient denies any other pedal complaints. Denies injury/trip/fall/sprain/any causative factors.   Patient Active Problem List   Diagnosis Date Noted  . Atrophic vaginitis 04/14/2018  . Atypical chest pain 02/11/2017  . Palpitation 12/31/2016  . Vaccine counseling 12/31/2016  . Screen for colon cancer 12/31/2016  . Acute back pain with sciatica, left 05/10/2016  . Gastroesophageal reflux disease without esophagitis 12/30/2015  . Rhinitis, allergic 12/30/2015  . Encounter for immunization 12/30/2015  . Need for influenza vaccination 12/30/2015  . Obesity 12/30/2015  . Chronic low back pain 12/30/2015  . Screening for osteoporosis 12/30/2015  . 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 diclofenac (VOLTAREN) 75 MG EC tablet Take 1 tablet (75 mg total) by mouth 2 (two) times daily. (Patient not taking: Reported on 01/01/2018) 180 tablet 0  . estradiol (ESTRACE) 1 MG tablet estradiol 1 mg tablet    . HYDROcodone-acetaminophen (NORCO) 7.5-325 MG tablet Take 1 tablet by mouth every 6 (six) hours as needed for moderate pain. (Patient not taking: Reported on 01/01/2018) 15 tablet 0  . ibuprofen (ADVIL,MOTRIN) 800 MG tablet ibuprofen 800 mg tablet    . medroxyPROGESTERone (PROVERA) 5 MG tablet medroxyprogesterone 5 mg tablet    . meloxicam  (MOBIC) 15 MG tablet Take 1 tablet (15 mg) by mouth daily as needed for pain    . methocarbamol (ROBAXIN) 500 MG tablet methocarbamol 500 mg tablet    . Multiple Vitamins-Minerals (HM MULTIVITAMIN ADULT GUMMY PO) Take 1 tablet by mouth daily.    . pantoprazole (PROTONIX) 40 MG tablet Take 1 tablet (40 mg total) by mouth 2 (two) times daily. (Patient not taking: Reported on 01/01/2018) 60 tablet 11  . ranitidine (ZANTAC) 150 MG capsule Take 1 capsule (150 mg total) by mouth daily. (Patient not taking: Reported on 01/01/2018) 30 capsule 0  . warfarin (COUMADIN) 5 MG tablet warfarin 5 mg tablet  TAKE 1 TABLET BY MOUTH EVERY DAY     Current Facility-Administered Medications on File Prior to Visit  Medication Dose Route Frequency Provider Last Rate Last Dose  . triamcinolone acetonide (KENALOG) 10 MG/ML injection 10 mg  10 mg Other Once Asencion Islam, DPM      . triamcinolone acetonide (KENALOG) 10 MG/ML injection 10 mg  10 mg Other Once Wheaton, Myya Meenach, DPM      . triamcinolone acetonide (KENALOG-40) injection 20 mg  20 mg Other Once Wilton, Jebidiah Baggerly, DPM      . triamcinolone acetonide (KENALOG-40) injection 20 mg  20 mg Other Once Asencion Islam, DPM        No Known Allergies  Objective:  General: Alert and oriented x3 in no acute distress  Dermatology: No open lesions bilateral lower extremities, no webspace macerations, no ecchymosis bilateral, all nails x 10 are well manicured.  Vascular: Dorsalis Pedis and Posterior Tibial  pedal pulses palpable, Capillary Fill Time 3 seconds,(+) pedal hair growth bilateral, no edema bilateral lower extremities, Temperature gradient within normal limits.  Neurology: Michaell CowingGross sensation intact via light touch bilateral.   Musculoskeletal: Mild tenderness with palpation at peroneal tendon course on right foot,No pain with calf compression bilateral.  No reproducible medial plantar fascial pain on right.  There is decreased ankle rom with knee extending  vs  flexed resembling gastroc equnius bilateral, Subtalar joint range of motion is within normal limits, there is no 1st ray hypermobility noted bilateral, decreased 1st MPJ rom Right>Left with functional limitus noted on weightbearing exam. Strength within normal limits in all groups bilateral.   Gait: Antalgic gait  Assessment and Plan: Problem List Items Addressed This Visit    None    Visit Diagnoses    Tendonitis    -  Primary   Relevant Medications   triamcinolone acetonide (KENALOG-40) injection 20 mg (Completed)   methylPREDNISolone (MEDROL DOSEPAK) 4 MG TBPK tablet   Inflammatory heel pain, right       Right foot pain           -Complete examination performed -Discussed treatement options for likely peroneal tendinitis -Rx Medrol Dosepak to take as instructed -After oral consent and aseptic prep, injected a mixture containing 1 ml of 2%  plain lidocaine, 1 ml 0.5% plain marcaine, 0.5 ml of kenalog 40 and 0.5 ml of dexamethasone phosphate along lateral heel and along peroneal tendon course on right without complication. Post-injection care discussed with patient.  -Dispensed a replacement ankle gauntlet -Advised patient rest ice elevation and protection until symptoms improve -Patient to return to office as needed or sooner if condition worsens.  If pain remains present will repeat x-ray at next visit.  [name] Iyauna Sing, DPM

## 2018-05-05 DIAGNOSIS — M545 Low back pain: Secondary | ICD-10-CM | POA: Diagnosis not present

## 2018-05-05 DIAGNOSIS — M25552 Pain in left hip: Secondary | ICD-10-CM | POA: Diagnosis not present

## 2018-05-06 ENCOUNTER — Ambulatory Visit: Payer: BLUE CROSS/BLUE SHIELD | Admitting: Sports Medicine

## 2018-05-20 DIAGNOSIS — M545 Low back pain: Secondary | ICD-10-CM | POA: Diagnosis not present

## 2018-05-20 DIAGNOSIS — Z6826 Body mass index (BMI) 26.0-26.9, adult: Secondary | ICD-10-CM | POA: Diagnosis not present

## 2018-05-21 ENCOUNTER — Other Ambulatory Visit: Payer: Self-pay | Admitting: Student

## 2018-05-21 DIAGNOSIS — M545 Low back pain: Principal | ICD-10-CM

## 2018-05-21 DIAGNOSIS — G8929 Other chronic pain: Secondary | ICD-10-CM

## 2018-05-28 ENCOUNTER — Other Ambulatory Visit: Payer: Self-pay | Admitting: Student

## 2018-05-28 ENCOUNTER — Ambulatory Visit
Admission: RE | Admit: 2018-05-28 | Discharge: 2018-05-28 | Disposition: A | Discharge status: Home / Self Care | Payer: BLUE CROSS/BLUE SHIELD | Source: Ambulatory Visit | Attending: Student | Admitting: Student

## 2018-05-28 DIAGNOSIS — M545 Low back pain, unspecified: Secondary | ICD-10-CM

## 2018-05-28 DIAGNOSIS — G8929 Other chronic pain: Secondary | ICD-10-CM

## 2018-05-28 MED ORDER — IOPAMIDOL (ISOVUE-M 200) INJECTION 41%
1.0000 mL | Freq: Once | INTRAMUSCULAR | Status: AC
  Administered 2018-05-28: 1 mL via INTRA_ARTICULAR

## 2018-05-28 MED ORDER — METHYLPREDNISOLONE ACETATE 40 MG/ML INJ SUSP (RADIOLOG
120.0000 mg | Freq: Once | INTRAMUSCULAR | Status: AC
  Administered 2018-05-28: 120 mg via INTRA_ARTICULAR

## 2018-05-28 NOTE — Discharge Instructions (Signed)

## 2018-11-19 DIAGNOSIS — Z01419 Encounter for gynecological examination (general) (routine) without abnormal findings: Secondary | ICD-10-CM | POA: Diagnosis not present

## 2018-11-19 DIAGNOSIS — Z6830 Body mass index (BMI) 30.0-30.9, adult: Secondary | ICD-10-CM | POA: Diagnosis not present

## 2018-11-19 DIAGNOSIS — Z1231 Encounter for screening mammogram for malignant neoplasm of breast: Secondary | ICD-10-CM | POA: Diagnosis not present

## 2018-11-20 LAB — HM MAMMOGRAPHY

## 2018-12-02 DIAGNOSIS — M545 Low back pain: Secondary | ICD-10-CM | POA: Diagnosis not present

## 2018-12-02 DIAGNOSIS — Z6825 Body mass index (BMI) 25.0-25.9, adult: Secondary | ICD-10-CM | POA: Diagnosis not present

## 2018-12-07 IMAGING — CR DG CHEST 2V
2 series · 2 of 2 positions shown · non-contrast
Comparison: None.

CLINICAL DATA: Chest pain.

EXAM:
CHEST  2 VIEW

[w chest pa]
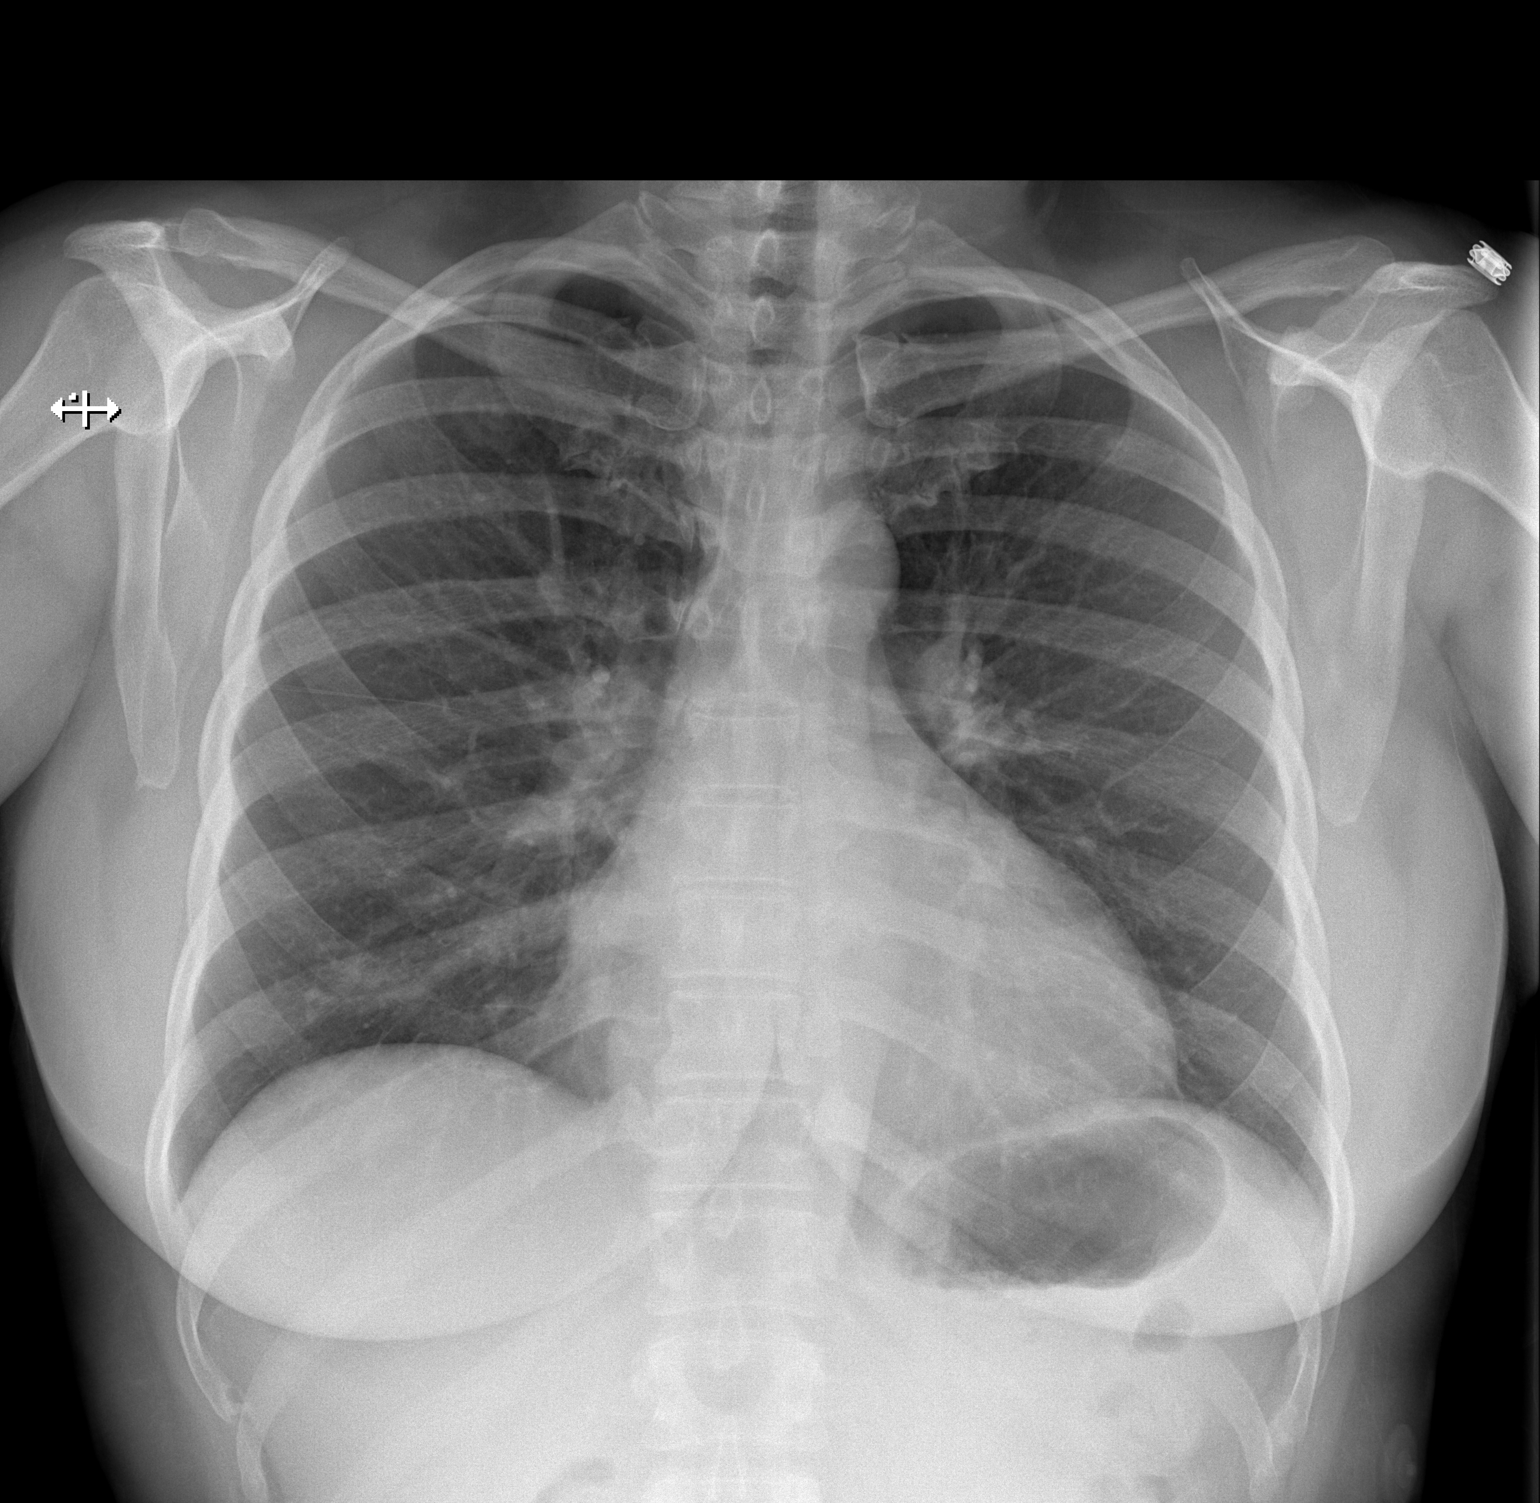

[w chest lat]
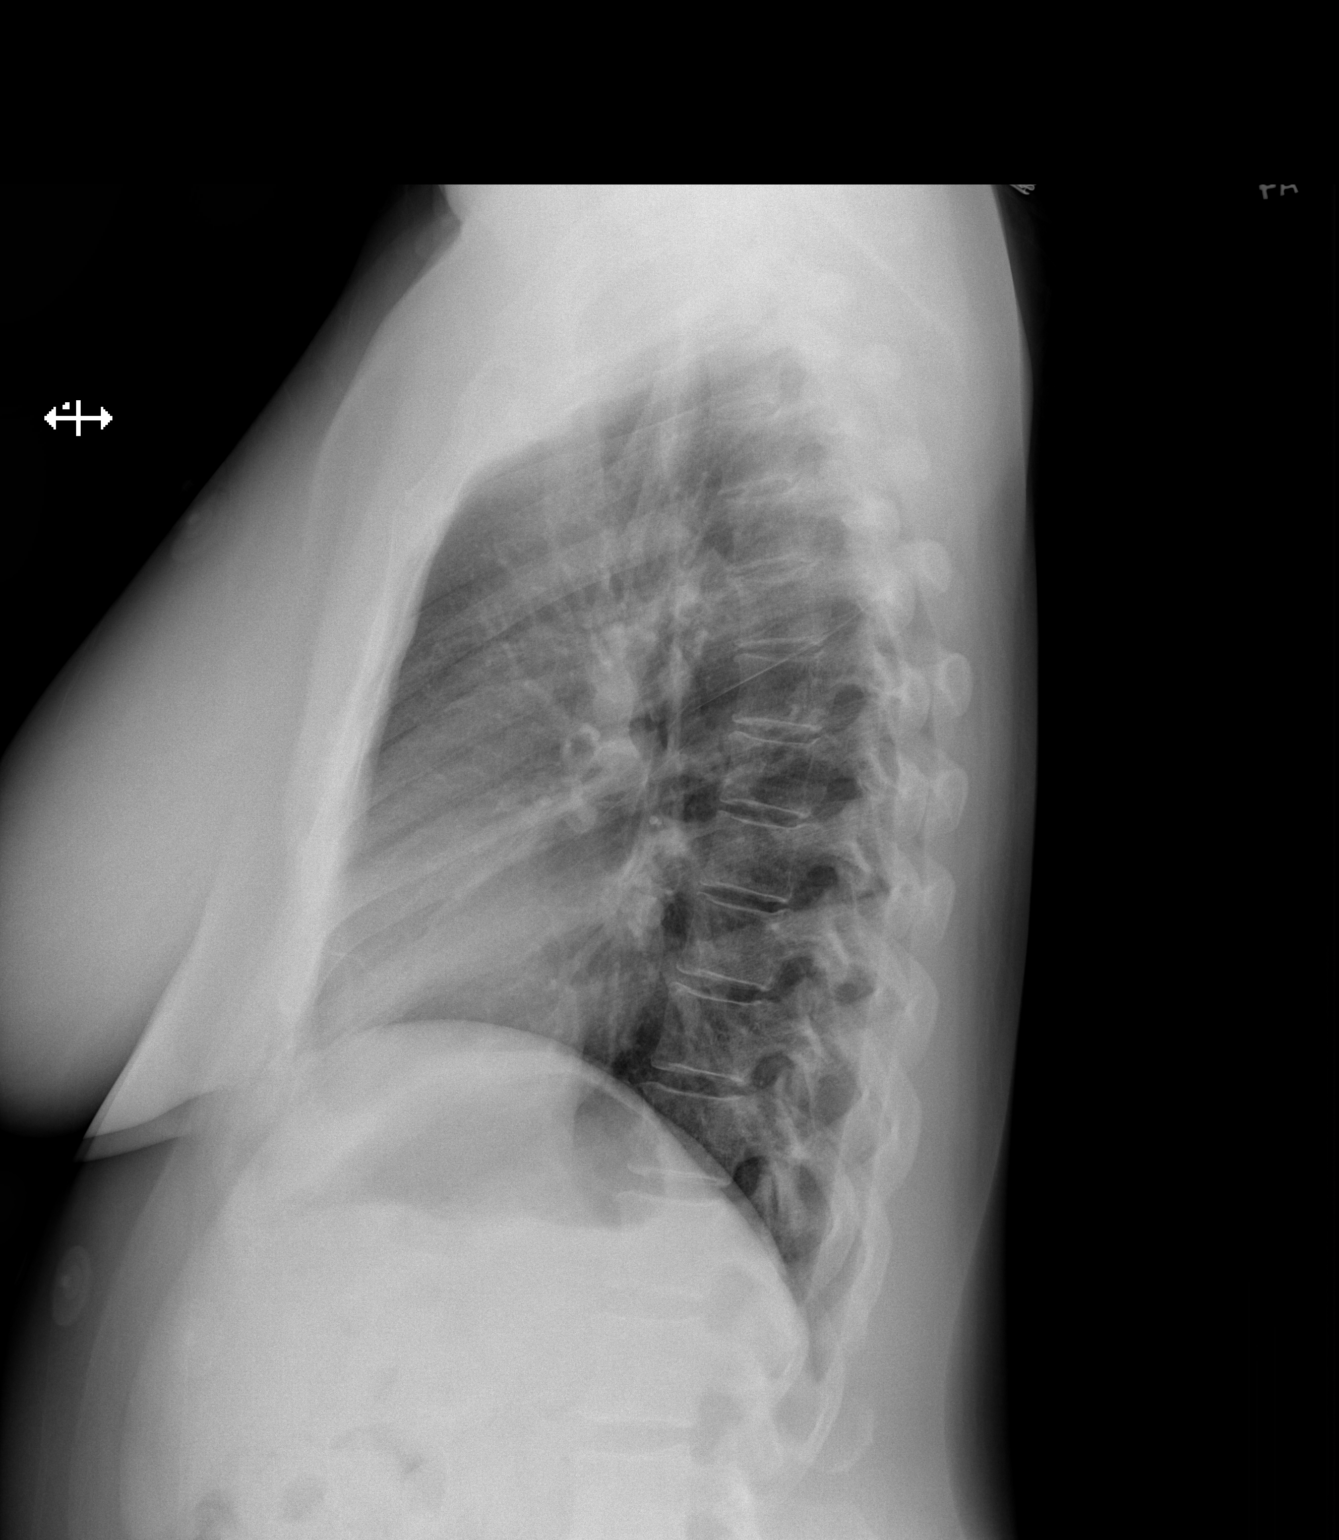

[2 of 2 positions shown; findings below may reference images not displayed]

FINDINGS: The cardial mediastinal silhouette is at the upper limits of normal
in size. Normal pulmonary vascularity. No focal consolidation,
pleural effusion, or pneumothorax. Symmetric densities over the
bilateral apices are consistent with electrode pads. No acute
osseous abnormality.
IMPRESSION: No active cardiopulmonary disease.

## 2019-01-05 ENCOUNTER — Encounter: Payer: Self-pay | Admitting: Medical

## 2019-01-05 ENCOUNTER — Other Ambulatory Visit: Payer: Self-pay

## 2019-01-05 ENCOUNTER — Ambulatory Visit (INDEPENDENT_AMBULATORY_CARE_PROVIDER_SITE_OTHER): Payer: BC Managed Care – PPO | Admitting: Medical

## 2019-01-05 VITALS — BP 100/80 | HR 89 | Temp 98.2°F | Ht 61.0 in | Wt 160.0 lb

## 2019-01-05 DIAGNOSIS — Z13220 Encounter for screening for lipoid disorders: Secondary | ICD-10-CM

## 2019-01-05 DIAGNOSIS — G8929 Other chronic pain: Secondary | ICD-10-CM | POA: Diagnosis not present

## 2019-01-05 DIAGNOSIS — Z0000 Encounter for general adult medical examination without abnormal findings: Secondary | ICD-10-CM | POA: Diagnosis not present

## 2019-01-05 DIAGNOSIS — Z7189 Other specified counseling: Secondary | ICD-10-CM

## 2019-01-05 DIAGNOSIS — Z7185 Encounter for immunization safety counseling: Secondary | ICD-10-CM

## 2019-01-05 MED ORDER — GABAPENTIN 100 MG PO CAPS: 200.0000 mg | ORAL_CAPSULE | Freq: Every day | ORAL | 1 refills | Status: DC

## 2019-01-05 NOTE — Patient Instructions (Signed)
Massage Therapy:  Janet Blevins [facility] [address] [place], [place] [address] [phone] [email or web address]   

## 2019-01-05 NOTE — Progress Notes (Signed)
Subjective:   HPI  [name] is a 53 y.o. female who presents for a complete physical.  Medical care team includes: [facility], Dr. Berna Bue neurosurgery, Dr. Roxanne Gates OBGYN, Dr. Henderson Cloud Gastroenterology, Dr. Ivory Broad, [name], PA-C here for primary care   Concerns: She continues to deal with significant chronic pain on a daily basis, lately the pains have been going down both eyes, at times can barely walk.  She was seeing orthopedics but recently was referred to neurosurgery.  Awaiting an updated MRI of lumbar spine.  They are considering surgery.  She tries to exercise.  She has used NSAIDs narcotics and Tylenol for pain over the course of the last 3 years.  She has had a few facet joint steroid injections as well.  But they did not last for very long.  Reviewed their medical, surgical, family, social, medication, and allergy history and updated chart as appropriate.  Past Medical History:  Diagnosis Date  . Allergy   . Atypical chest pain 02/11/2017  . Bursitis of shoulder, right   . Chronic back pain    since 2018  . GERD (gastroesophageal reflux disease)   . Heart murmur    as a child  . Knee pain, bilateral    intermittent pain, ran track in high school; OA  . Obesity   . Routine gynecological examination    Dr. Henderson Cloud  . Wisdom teeth extracted     Past Surgical History:  Procedure Laterality Date  . epidural steroid injection, lumbar spine  2019  . FACET JOINT INJECTION  2019  . KNEE ARTHROSCOPY WITH DRILLING/MICROFRACTURE Left 10/06/2013   Procedure: KNEE ARTHROSCOPY WITH DRILLING/MICROFRACTURE AND ALLOGRAFT CARTLAGE IMPLANTATION, CHONDRAPLASTY OF TROCHIAL AND PATELLA;  Surgeon: Cammy Copa, MD;  Location: [facility] OR;  Service: Orthopedics;  Laterality: Left;  . KNEE SURGERY  1989   tendonitis, right knee;   . TUBAL LIGATION      Social History   Socioeconomic History  . Marital status: Married    Spouse name: Not on  file  . Number of children: Not on file  . Years of education: Not on file  . Highest education level: Not on file  Occupational History  . Not on file  Social Needs  . Financial resource strain: Not on file  . Food insecurity    Worry: Not on file    Inability: Not on file  . Transportation needs    Medical: Not on file    Non-medical: Not on file  Tobacco Use  . Smoking status: Never Smoker  . Smokeless tobacco: Never Used  Substance and Sexual Activity  . Alcohol use: No  . Drug use: No  . Sexual activity: Not on file  Lifestyle  . Physical activity    Days per week: Not on file    Minutes per session: Not on file  . Stress: Not on file  Relationships  . Social Musician on phone: Not on file    Gets together: Not on file    Attends religious service: Not on file    Active member of club or organization: Not on file    Attends meetings of clubs or organizations: Not on file    Relationship status: Not on file  . Intimate partner violence    Fear of current or ex partner: Not on file    Emotionally abused: Not on file    Physically abused: Not on file  Forced sexual activity: Not on file  Other Topics Concern  . 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  Hobbies - walking, makes jewelry, likes adult coloring books.  12/2018    Family History  Problem Relation Age of Onset  . Hypertension Mother   . Kidney disease Mother        hypertensive kidney failure, renal transplant  . Diabetes Mother   . Heart disease Mother [id number]       CHF  . Heart failure Mother   . Stroke Mother   . Hypertension Father   . Sleep apnea Father   . Cancer Father        leukemia  . Fibroids Sister   . Hypertension Sister      Current Outpatient Medications:  Marland Kitchen.  Multiple  Vitamins-Minerals (HM MULTIVITAMIN ADULT GUMMY PO), Take 1 tablet by mouth daily., Disp: , Rfl:  .  pantoprazole (PROTONIX) 40 MG tablet, Take 1 tablet (40 mg total) by mouth 2 (two) times daily., Disp: 60 tablet, Rfl: 11 .  estradiol (ESTRACE) 1 MG tablet, estradiol 1 mg tablet, Disp: , Rfl:  .  gabapentin (NEURONTIN) 100 MG capsule, Take 2 capsules (200 mg total) by mouth at bedtime., Disp: 60 capsule, Rfl: 1 .  ranitidine (ZANTAC) 150 MG capsule, Take 1 capsule (150 mg total) by mouth daily. (Patient not taking: Reported on 01/01/2018), Disp: 30 capsule, Rfl: 0  Current Facility-Administered Medications:  .  triamcinolone acetonide (KENALOG) 10 MG/ML injection 10 mg, 10 mg, Other, Once, Stover, Titorya, DPM .  triamcinolone acetonide (KENALOG) 10 MG/ML injection 10 mg, 10 mg, Other, Once, Stover, Titorya, DPM .  triamcinolone acetonide (KENALOG-40) injection 20 mg, 20 mg, Other, Once, Stover, Titorya, DPM .  triamcinolone acetonide (KENALOG-40) injection 20 mg, 20 mg, Other, Once, Stover, Titorya, DPM  No Known Allergies   Review of Systems Constitutional: -fever, -chills, -sweats, -unexpected weight change, -decreased appetite, -fatigue Allergy: -sneezing, -itching, -congestion Dermatology: -changing moles, -rash, -lumps ENT: -runny nose, -ear pain, -sore throat, -hoarseness, -sinus pain, -teeth pain, - ringing in ears, -hearing loss, -nosebleeds Cardiology: -chest pain, -palpitations, -swelling, -difficulty breathing when lying flat, -waking up short of breath Respiratory: -cough, -shortness of breath, -difficulty breathing with exercise or exertion, -wheezing, -coughing up blood Gastroenterology: -abdominal pain, -nausea, -vomiting, -diarrhea, -constipation, -blood in stool, -changes in bowel movement, -difficulty swallowing or eating Hematology: -bleeding, -bruising  Musculoskeletal: -joint aches, -muscle aches, -joint swelling, +back pain, -neck pain, -cramping, -changes in  gait Ophthalmology: denies vision changes, eye redness, itching, discharge Urology: -burning with urination, -difficulty urinating, -blood in urine, -urinary frequency, -urgency, -incontinence Neurology: -headache, -weakness, -tingling, -numbness, -memory loss, -falls, -dizziness Psychology: -depressed mood, -agitation, -sleep problems     Objective:   Physical Exam  BP 100/80   Pulse 89   Temp 98.2 F (36.8 C)   Ht 5\' 1"  (1.549 m)   Wt 160 lb (72.6 kg)   SpO2 98%   BMI 30.23 kg/m   Wt Readings from Last 3 Encounters:  01/05/19 160 lb (72.6 kg)  01/01/18 166 lb (75.3 kg)  03/13/17 171 lb 12.8 oz (77.9 kg)    General appearance: alert, no distress, WD/WN, AA female Skin: no worrisome lesions HEENT: normocephalic, conjunctiva/corneas normal, sclerae anicteric, PERRLA, EOMi, nares patent, no discharge or erythema, pharynx normal Oral  cavity: MMM, no worrisome lesions Neck: supple, no lymphadenopathy, no thyromegaly, no masses, normal ROM, no bruits Chest: non tender, normal shape and expansion Heart: RRR, normal S1, S2, no murmurs Lungs: CTA bilaterally, no wheezes, rhonchi, or rales Abdomen: +bs, soft, non tender, non distended, no masses, no hepatomegaly, no splenomegaly, no bruits Back: tender lumbar region, ROM limited due to pain, no scoliosis Musculoskeletal: upper extremities non tender, no obvious deformity, normal ROM throughout, lower extremities non tender, no obvious deformity, normal ROM throughout Extremities: no edema, no cyanosis, no clubbing Pulses: 2+ symmetric, upper and lower extremities, normal cap refill Neurological:  DTRs 1+ UE and LE, otherwise alert, oriented x 3, CN2-12 intact, strength normal upper extremities and lower extremities, sensation normal throughout, no cerebellar signs, gait normal Psychiatric: normal affect, behavior normal, pleasant  Breast/gyn/rectal - deferred to gyn   Assessment and Plan :    Encounter Diagnoses  Name Primary?   . Encounter for health maintenance examination in adult Yes  . Other chronic pain   . Vaccine counseling    Physical exam - discussed healthy lifestyle, diet, exercise, preventative care, vaccinations, and addressed their concerns.  Handout given.  See your eye doctor yearly for routine vision care. See your dentist yearly for routine dental care including hygiene visits twice yearly. See your gynecologist yearly for routine gynecological care.  Cancer screening We are requesting copy of her last Pap smear and mammogram through gynecology  I reviewed her colonoscopy from November 2019, poor prep and she has 2 repeat this year   Vaccines: Flu shot up-to-date  Recommended tetanus and shingles vaccine.  She will consider.  She thinks her last tetanus shot was 2012   Other significant issues Chronic pain: See neurology/neurosurgery, awaiting updated lumbar spine MRI.  We discussed her pain, we discussed the last MRI a year ago which did not look all that significant compared to the level of pain.  Begin trial of gabapentin.  She has not used any other medication other than NSAIDs, Tylenol and narcotic pain medication.  Discussed risk and benefits of gabapentin, proper use.  Follow-up in 3 to 4 weeks..  Follow-up pending labs   Gwynn was seen today for annual exam.  Diagnoses and all orders for this visit:  Encounter for health maintenance examination in adult -     Comprehensive metabolic panel -     CBC with Differential/Platelet -     Lipid panel  Other chronic pain  Vaccine counseling  Other orders -     gabapentin (NEURONTIN) 100 MG capsule; Take 2 capsules (200 mg total) by mouth at bedtime.

## 2019-01-06 ENCOUNTER — Encounter: Payer: Self-pay | Admitting: Medical

## 2019-01-06 LAB — CBC WITH DIFFERENTIAL/PLATELET
Basophils Absolute: 0 10*3/uL (ref 0.0–0.2)
Basos: 0 %
EOS (ABSOLUTE): 0.1 10*3/uL (ref 0.0–0.4)
Eos: 2 %
Hematocrit: 36.8 % (ref 34.0–46.6)
Hemoglobin: 12.4 g/dL (ref 11.1–15.9)
Immature Grans (Abs): 0 10*3/uL (ref 0.0–0.1)
Immature Granulocytes: 0 %
Lymphocytes Absolute: 1.8 10*3/uL (ref 0.7–3.1)
Lymphs: 34 %
MCH: 28.5 pg (ref 26.6–33.0)
MCHC: 33.7 g/dL (ref 31.5–35.7)
MCV: 85 fL (ref 79–97)
Monocytes Absolute: 0.3 10*3/uL (ref 0.1–0.9)
Monocytes: 6 %
Neutrophils Absolute: 3 10*3/uL (ref 1.4–7.0)
Neutrophils: 58 %
Platelets: 316 10*3/uL (ref 150–450)
RBC: 4.35 x10E6/uL (ref 3.77–5.28)
RDW: 12 % (ref 11.7–15.4)
WBC: 5.2 10*3/uL (ref 3.4–10.8)

## 2019-01-06 LAB — LIPID PANEL
Chol/HDL Ratio: 3.1 ratio (ref 0.0–4.4)
Cholesterol, Total: 223 mg/dL — ABNORMAL HIGH (ref 100–199)
HDL: 72 mg/dL (ref >39)
LDL Chol Calc (NIH): 137 mg/dL — ABNORMAL HIGH (ref 0–99)
Triglycerides: 81 mg/dL (ref 0–149)
VLDL Cholesterol Cal: 14 mg/dL (ref 5–40)

## 2019-01-06 LAB — COMPREHENSIVE METABOLIC PANEL
ALT: 14 IU/L (ref 0–32)
AST: 18 IU/L (ref 0–40)
Albumin/Globulin Ratio: 1.5 (ref 1.2–2.2)
Albumin: 4.8 g/dL (ref 3.8–4.9)
Alkaline Phosphatase: 75 IU/L (ref 39–117)
BUN/Creatinine Ratio: 17 (ref 9–23)
BUN: 16 mg/dL (ref 6–24)
Bilirubin Total: 0.3 mg/dL (ref 0.0–1.2)
CO2: 24 mmol/L (ref 20–29)
Calcium: 10.4 mg/dL — ABNORMAL HIGH (ref 8.7–10.2)
Chloride: 106 mmol/L (ref 96–106)
Creatinine, Ser: 0.96 mg/dL (ref 0.57–1.00)
GFR calc Af Amer: 79 mL/min/{1.73_m2} (ref >59)
GFR calc non Af Amer: 68 mL/min/{1.73_m2} (ref >59)
Globulin, Total: 3.2 g/dL (ref 1.5–4.5)
Glucose: 94 mg/dL (ref 65–99)
Potassium: 4.3 mmol/L (ref 3.5–5.2)
Sodium: 143 mmol/L (ref 134–144)
Total Protein: 8 g/dL (ref 6.0–8.5)

## 2019-01-12 ENCOUNTER — Other Ambulatory Visit: Payer: Self-pay | Admitting: Neurosurgery

## 2019-01-12 DIAGNOSIS — G8929 Other chronic pain: Secondary | ICD-10-CM

## 2019-01-16 ENCOUNTER — Ambulatory Visit
Admission: RE | Admit: 2019-01-16 | Discharge: 2019-01-16 | Disposition: A | Discharge status: Home / Self Care | Payer: BLUE CROSS/BLUE SHIELD | Source: Ambulatory Visit | Attending: Neurosurgery | Admitting: Neurosurgery

## 2019-01-16 ENCOUNTER — Telehealth: Payer: Self-pay | Admitting: Medical

## 2019-01-16 ENCOUNTER — Other Ambulatory Visit: Payer: Self-pay

## 2019-01-16 DIAGNOSIS — G8929 Other chronic pain: Secondary | ICD-10-CM

## 2019-01-16 DIAGNOSIS — M545 Low back pain: Secondary | ICD-10-CM | POA: Diagnosis not present

## 2019-01-16 NOTE — Telephone Encounter (Signed)
Requested records received from [facility] °

## 2019-01-21 ENCOUNTER — Encounter: Payer: Self-pay | Admitting: Medical

## 2019-01-29 ENCOUNTER — Other Ambulatory Visit: Payer: Self-pay | Admitting: Neurosurgery

## 2019-01-29 DIAGNOSIS — M545 Low back pain: Secondary | ICD-10-CM | POA: Diagnosis not present

## 2019-01-29 DIAGNOSIS — G8929 Other chronic pain: Secondary | ICD-10-CM

## 2019-01-29 DIAGNOSIS — Z6825 Body mass index (BMI) 25.0-25.9, adult: Secondary | ICD-10-CM | POA: Diagnosis not present

## 2019-02-04 ENCOUNTER — Ambulatory Visit
Admission: RE | Admit: 2019-02-04 | Discharge: 2019-02-04 | Disposition: A | Discharge status: Home / Self Care | Payer: BC Managed Care – PPO | Source: Ambulatory Visit | Attending: Neurosurgery | Admitting: Neurosurgery

## 2019-02-04 DIAGNOSIS — M545 Low back pain, unspecified: Secondary | ICD-10-CM

## 2019-02-04 DIAGNOSIS — G8929 Other chronic pain: Secondary | ICD-10-CM | POA: Diagnosis not present

## 2019-02-04 MED ORDER — IOPAMIDOL (ISOVUE-M 200) INJECTION 41%
1.0000 mL | Freq: Once | INTRAMUSCULAR | Status: AC
  Administered 2019-02-04: 1 mL via INTRA_ARTICULAR

## 2019-02-04 MED ORDER — METHYLPREDNISOLONE ACETATE 40 MG/ML INJ SUSP (RADIOLOG
120.0000 mg | Freq: Once | INTRAMUSCULAR | Status: AC
  Administered 2019-02-04: 120 mg via INTRA_ARTICULAR

## 2019-02-04 NOTE — Discharge Instructions (Signed)

## 2019-02-10 DIAGNOSIS — Z1211 Encounter for screening for malignant neoplasm of colon: Secondary | ICD-10-CM | POA: Diagnosis not present

## 2019-02-10 DIAGNOSIS — E669 Obesity, unspecified: Secondary | ICD-10-CM | POA: Diagnosis not present

## 2019-02-10 DIAGNOSIS — K5900 Constipation, unspecified: Secondary | ICD-10-CM | POA: Diagnosis not present

## 2019-03-10 HISTORY — PX: COLONOSCOPY: SHX174

## 2019-03-17 ENCOUNTER — Other Ambulatory Visit: Payer: Self-pay | Admitting: Medical

## 2019-03-18 DIAGNOSIS — D125 Benign neoplasm of sigmoid colon: Secondary | ICD-10-CM | POA: Diagnosis not present

## 2019-03-18 DIAGNOSIS — K5730 Diverticulosis of large intestine without perforation or abscess without bleeding: Secondary | ICD-10-CM | POA: Diagnosis not present

## 2019-03-18 DIAGNOSIS — Z1211 Encounter for screening for malignant neoplasm of colon: Secondary | ICD-10-CM | POA: Diagnosis not present

## 2019-03-18 DIAGNOSIS — K635 Polyp of colon: Secondary | ICD-10-CM | POA: Diagnosis not present

## 2019-03-18 LAB — HM COLONOSCOPY

## 2019-03-20 ENCOUNTER — Encounter: Payer: Self-pay | Admitting: Medical

## 2019-04-07 DIAGNOSIS — M545 Low back pain: Secondary | ICD-10-CM | POA: Diagnosis not present

## 2019-04-07 DIAGNOSIS — M25561 Pain in right knee: Secondary | ICD-10-CM | POA: Diagnosis not present

## 2019-04-10 DIAGNOSIS — M25561 Pain in right knee: Secondary | ICD-10-CM | POA: Diagnosis not present

## 2019-04-10 DIAGNOSIS — M545 Low back pain: Secondary | ICD-10-CM | POA: Diagnosis not present

## 2019-04-12 ENCOUNTER — Encounter: Payer: Self-pay | Admitting: Medical

## 2019-04-15 ENCOUNTER — Other Ambulatory Visit: Payer: Self-pay | Admitting: Student

## 2019-04-15 DIAGNOSIS — G8929 Other chronic pain: Secondary | ICD-10-CM

## 2019-04-20 ENCOUNTER — Ambulatory Visit
Admission: RE | Admit: 2019-04-20 | Discharge: 2019-04-20 | Disposition: A | Discharge status: Home / Self Care | Payer: BC Managed Care – PPO | Source: Ambulatory Visit | Attending: Student | Admitting: Student

## 2019-04-20 ENCOUNTER — Other Ambulatory Visit: Payer: Self-pay

## 2019-04-20 DIAGNOSIS — M4696 Unspecified inflammatory spondylopathy, lumbar region: Secondary | ICD-10-CM | POA: Diagnosis not present

## 2019-04-20 DIAGNOSIS — G8929 Other chronic pain: Secondary | ICD-10-CM

## 2019-04-20 DIAGNOSIS — M545 Low back pain, unspecified: Secondary | ICD-10-CM

## 2019-04-20 MED ORDER — METHYLPREDNISOLONE ACETATE 40 MG/ML INJ SUSP (RADIOLOG
120.0000 mg | Freq: Once | INTRAMUSCULAR | Status: AC
  Administered 2019-04-20: 120 mg via INTRA_ARTICULAR

## 2019-04-20 MED ORDER — IOPAMIDOL (ISOVUE-M 200) INJECTION 41%
1.0000 mL | Freq: Once | INTRAMUSCULAR | Status: AC
  Administered 2019-04-20: 14:00:00 1 mL via INTRA_ARTICULAR

## 2019-04-20 NOTE — Discharge Instructions (Signed)

## 2019-04-23 ENCOUNTER — Encounter: Payer: Self-pay | Admitting: Medical

## 2019-06-04 ENCOUNTER — Ambulatory Visit: Payer: BC Managed Care – PPO | Attending: Family

## 2019-06-04 DIAGNOSIS — Z23 Encounter for immunization: Secondary | ICD-10-CM | POA: Insufficient documentation

## 2019-06-04 NOTE — Progress Notes (Signed)
   Covid-19 Vaccination Clinic  Name:  [name]    MRN: [id number] DOB: 01-03-1966  06/04/2019  [name] was observed post Covid-19 immunization for 15 minutes without incidence. She was provided with Vaccine Information Sheet and instruction to access the V-Safe system.   [name] was instructed to call 911 with any severe reactions post vaccine: Marland Kitchen Difficulty breathing  . Swelling of your face and throat  . A fast heartbeat  . A bad rash all over your body  . Dizziness and weakness    Immunizations Administered    Name Date Dose VIS Date Route   Moderna COVID-19 Vaccine 06/04/2019 12:06 PM 0.5 mL 03/10/2019 Intramuscular   Manufacturer: Moderna   Lot: [id number]   NDC: 03833-383-29

## 2019-07-07 ENCOUNTER — Ambulatory Visit: Payer: BC Managed Care – PPO | Attending: Family

## 2019-07-07 DIAGNOSIS — Z23 Encounter for immunization: Secondary | ICD-10-CM

## 2019-07-07 NOTE — Progress Notes (Signed)
   Covid-19 Vaccination Clinic  Name:  [name]    MRN: [id number] DOB: 1966/01/31  07/07/2019  [name] was observed post Covid-19 immunization for 15 minutes without incident. She was provided with Vaccine Information Sheet and instruction to access the V-Safe system.   [name] was instructed to call 911 with any severe reactions post vaccine: Marland Kitchen Difficulty breathing  . Swelling of face and throat  . A fast heartbeat  . A bad rash all over body  . Dizziness and weakness   Immunizations Administered    Name Date Dose VIS Date Route   Moderna COVID-19 Vaccine 07/07/2019 12:04 PM 0.5 mL 03/10/2019 Intramuscular   Manufacturer: Moderna   Lot: [id number]   NDC: 61518-343-73

## 2019-07-21 ENCOUNTER — Other Ambulatory Visit: Payer: Self-pay

## 2019-07-21 ENCOUNTER — Ambulatory Visit: Payer: BC Managed Care – PPO | Admitting: Medical

## 2019-07-21 VITALS — BP 104/70 | HR 77 | Temp 98.8°F | Ht 61.0 in | Wt 160.8 lb

## 2019-07-21 DIAGNOSIS — M545 Low back pain: Secondary | ICD-10-CM

## 2019-07-21 DIAGNOSIS — M7061 Trochanteric bursitis, right hip: Secondary | ICD-10-CM | POA: Diagnosis not present

## 2019-07-21 DIAGNOSIS — M2140 Flat foot [pes planus] (acquired), unspecified foot: Secondary | ICD-10-CM | POA: Insufficient documentation

## 2019-07-21 DIAGNOSIS — S86911A Strain of unspecified muscle(s) and tendon(s) at lower leg level, right leg, initial encounter: Secondary | ICD-10-CM | POA: Diagnosis not present

## 2019-07-21 DIAGNOSIS — G8929 Other chronic pain: Secondary | ICD-10-CM

## 2019-07-21 DIAGNOSIS — M2142 Flat foot [pes planus] (acquired), left foot: Secondary | ICD-10-CM

## 2019-07-21 DIAGNOSIS — M2141 Flat foot [pes planus] (acquired), right foot: Secondary | ICD-10-CM

## 2019-07-21 MED ORDER — NAPROXEN 500 MG PO TABS: 500.0000 mg | ORAL_TABLET | Freq: Two times a day (BID) | ORAL | 0 refills | Status: DC

## 2019-07-21 MED ORDER — HYDROCODONE-ACETAMINOPHEN 5-325 MG PO TABS: 1.0000 | ORAL_TABLET | Freq: Four times a day (QID) | ORAL | 0 refills | Status: DC | PRN

## 2019-07-21 NOTE — Progress Notes (Signed)
Subjective:  Chief Complaint  Patient presents with  . Leg Pain    right    Here for right leg pain x1 week.  She denies specific injury or trauma.  She does use the stairs up and down her house.  She is a Pharmacist, hospital and stands for prolonged periods.  Sometimes the right leg feels tingly.  Her pain is mostly in the right lower leg of the shin and has been said it seems swollen.  She is also tender over the right lateral hip.  She has chronic back pain and has had and has been getting spinal injections through neurosurgery including in January.  She notes some chronic low back pain, but no worse than usual.  She is using stretching.  No other aggravating or relieving factors other than some over-the-counter ibuprofen   Past Medical History:  Diagnosis Date  . Allergy   . Atypical chest pain 02/11/2017  . Bursitis of shoulder, right   . Chronic back pain    since 2018  . GERD (gastroesophageal reflux disease)   . Heart murmur    as a child  . Knee pain, bilateral    intermittent pain, ran track in high school; OA  . Obesity   . Routine gynecological examination    Dr. Philis Pique  . Wisdom teeth extracted    Current Outpatient Medications on File Prior to Visit  Medication Sig Dispense Refill  . estradiol (ESTRACE) 1 MG tablet estradiol 1 mg tablet    . gabapentin (NEURONTIN) 100 MG capsule TAKE 2 CAPSULES (200 MG TOTAL) BY MOUTH AT BEDTIME. 60 capsule 1  . Multiple Vitamins-Minerals (HM MULTIVITAMIN ADULT GUMMY PO) Take 1 tablet by mouth daily.    . pantoprazole (PROTONIX) 40 MG tablet Take 1 tablet (40 mg total) by mouth 2 (two) times daily. 60 tablet 11  . ranitidine (ZANTAC) 150 MG capsule Take 1 capsule (150 mg total) by mouth daily. (Patient not taking: Reported on 01/01/2018) 30 capsule 0   Current Facility-Administered Medications on File Prior to Visit  Medication Dose Route Frequency Provider Last Rate Last Admin  . triamcinolone acetonide (KENALOG) 10 MG/ML injection 10 mg  10  mg Other Once Hoopa, Titorya, DPM      . triamcinolone acetonide (KENALOG) 10 MG/ML injection 10 mg  10 mg Other Once Stover, Titorya, DPM      . triamcinolone acetonide (KENALOG-40) injection 20 mg  20 mg Other Once Stover, Titorya, DPM      . triamcinolone acetonide (KENALOG-40) injection 20 mg  20 mg Other Once Stover, Titorya, DPM       ROS as in subjective    Objective: BP 104/70   Pulse 77   Temp 98.8 F (37.1 C)   Ht 5\' 1"  (1.549 m)   Wt 160 lb 12.8 oz (72.9 kg)   SpO2 98%   BMI 30.38 kg/m    Gen: wd,wn, nad, African Amercian female Skin unremarkable, no erythema, no bruising Right lower lateral leg with tenderness in the muscle tissue seen swollen inflamed in the same area anterior laterally.  No calf tenderness, no deformity no swelling.  Tender over the right trochanteric bursa.  Otherwise hip rotation normal without pain.  Leg otherwise nontender with normal range of motion.  She seems somewhat guarded and seems to have some pain in the right leg with weightbearing Otherwise legs neurovascularly intact. Generalized lumbar spinal paraspinal tenderness   Assessment: Encounter Diagnoses  Name Primary?  . Muscle strain of lower leg,  right, initial encounter Yes  . Trochanteric bursitis of right hip   . Chronic low back pain, unspecified back pain laterality, unspecified whether sciatica present   . Pes planus of both feet      Plan: Symptoms and exam show flat feet.  She also has acute muscle strain and inflammation in the right anterior lateral lower leg.  Begin medication as below.  Discussed proper use of medicine, risk and benefits of medication.  Advise she take the next 4 to 5 days to rest, elevate the leg, use cool therapy to the right leg, medications as prescribed.  If not much improved overall next week then call back.  Otherwise the symptoms should gradually calm down discussed stretching and gradual return activity as symptoms are much improved hopefully  within the next week  Sharene was seen today for leg pain.  Diagnoses and all orders for this visit:  Muscle strain of lower leg, right, initial encounter  Trochanteric bursitis of right hip  Chronic low back pain, unspecified back pain laterality, unspecified whether sciatica present  Pes planus of both feet  Other orders -     HYDROcodone-acetaminophen (NORCO) 5-325 MG tablet; Take 1 tablet by mouth every 6 (six) hours as needed. -     naproxen (NAPROSYN) 500 MG tablet; Take 1 tablet (500 mg total) by mouth 2 (two) times daily with a meal.

## 2019-07-31 ENCOUNTER — Other Ambulatory Visit: Payer: Self-pay

## 2019-07-31 ENCOUNTER — Ambulatory Visit: Payer: BC Managed Care – PPO | Admitting: Medical

## 2019-07-31 ENCOUNTER — Encounter: Payer: Self-pay | Admitting: Medical

## 2019-07-31 VITALS — BP 120/80 | HR 78 | Temp 98.2°F | Ht 61.0 in | Wt 164.7 lb

## 2019-07-31 DIAGNOSIS — M25512 Pain in left shoulder: Secondary | ICD-10-CM | POA: Diagnosis not present

## 2019-07-31 DIAGNOSIS — M25532 Pain in left wrist: Secondary | ICD-10-CM

## 2019-07-31 DIAGNOSIS — M2550 Pain in unspecified joint: Secondary | ICD-10-CM | POA: Diagnosis not present

## 2019-07-31 DIAGNOSIS — M25511 Pain in right shoulder: Secondary | ICD-10-CM | POA: Insufficient documentation

## 2019-07-31 DIAGNOSIS — M7989 Other specified soft tissue disorders: Secondary | ICD-10-CM | POA: Insufficient documentation

## 2019-07-31 DIAGNOSIS — M79604 Pain in right leg: Secondary | ICD-10-CM | POA: Insufficient documentation

## 2019-07-31 DIAGNOSIS — M2560 Stiffness of unspecified joint, not elsewhere classified: Secondary | ICD-10-CM | POA: Insufficient documentation

## 2019-07-31 DIAGNOSIS — R5383 Other fatigue: Secondary | ICD-10-CM | POA: Insufficient documentation

## 2019-07-31 DIAGNOSIS — M25531 Pain in right wrist: Secondary | ICD-10-CM | POA: Insufficient documentation

## 2019-07-31 NOTE — Progress Notes (Signed)
Subjective:  [name] is a 54 y.o. female who presents for Chief Complaint  Patient presents with  . Arthritis    pain all over since last Friday      Here for pains all over..   Since last visit 07/21/19  Feels like shoulder bilat have someone sitting on them, aches from shoulder bilat all the way to feet.  Arms hurt, legs hurt, arms feel heavy.  Worse pain currently is both shoulder, right lower leg, feet ache like she has been standing for a long time.  No other swelling.  Sometimes gets pains in both wrist and hands but not today.  Feels stiff in the mornings.  Today has felt stiff all day.   Takes a shower to "loosen up."    Has pain in both knees , worse on right.   No elbow pains.   Has pains in right hip.    I saw her 07/21/19 for back pain, pain in hip/bursitis, and that same week she notes she  didn't work the rest of that week due to pain and feeling wiped out.    No fever.  Stays cold in general.  Occasional feels numb in right anterior lower leg where it feels swollen.  otherwise no paresthias.     No fall, no injury, no trauma.  nonsmoker  Taking Gabapentin nightly, Naprosyn BID, but hasn't been taking hydrocodone. Will occasionally use Hydrocodone for worse pain to help rest at night.   No other aggravating or relieving factors.    No other c/o.  Past Medical History:  Diagnosis Date  . Allergy   . Atypical chest pain 02/11/2017  . Bursitis of shoulder, right   . Chronic back pain    since 2018  . GERD (gastroesophageal reflux disease)   . Heart murmur    as a child  . Knee pain, bilateral    intermittent pain, ran track in high school; OA  . Obesity   . Routine gynecological examination    Dr. Philis Pique  . Wisdom teeth extracted    Current Outpatient Medications on File Prior to Visit  Medication Sig Dispense Refill  . gabapentin (NEURONTIN) 100 MG capsule TAKE 2 CAPSULES (200 MG TOTAL) BY MOUTH AT BEDTIME. 60 capsule 1  .  HYDROcodone-acetaminophen (NORCO) 5-325 MG tablet Take 1 tablet by mouth every 6 (six) hours as needed. 12 tablet 0  . Multiple Vitamins-Minerals (HM MULTIVITAMIN ADULT GUMMY PO) Take 1 tablet by mouth daily.    . naproxen (NAPROSYN) 500 MG tablet Take 1 tablet (500 mg total) by mouth 2 (two) times daily with a meal. 30 tablet 0  . pantoprazole (PROTONIX) 40 MG tablet Take 1 tablet (40 mg total) by mouth 2 (two) times daily. 60 tablet 11  . estradiol (ESTRACE) 1 MG tablet estradiol 1 mg tablet    . ranitidine (ZANTAC) 150 MG capsule Take 1 capsule (150 mg total) by mouth daily. (Patient not taking: Reported on 07/31/2019) 30 capsule 0   Current Facility-Administered Medications on File Prior to Visit  Medication Dose Route Frequency Provider Last Rate Last Admin  . triamcinolone acetonide (KENALOG) 10 MG/ML injection 10 mg  10 mg Other Once Landis Martins, DPM      . triamcinolone acetonide (KENALOG) 10 MG/ML injection 10 mg  10 mg Other Once [place], [place], DPM      . triamcinolone acetonide (KENALOG-40) injection 20 mg  20 mg Other Once Landis Martins, DPM      . triamcinolone  acetonide (KENALOG-40) injection 20 mg  20 mg Other Once Asencion Islam, DPM       Family History  Problem Relation Age of Onset  . Hypertension Mother   . Kidney disease Mother        hypertensive kidney failure, renal transplant  . Diabetes Mother   . Heart disease Mother 45       CHF  . Heart failure Mother   . Stroke Mother   . Hypertension Father   . Sleep apnea Father   . Cancer Father        leukemia  . Fibroids Sister   . Hypertension Sister    Past Surgical History:  Procedure Laterality Date  . epidural steroid injection, lumbar spine  2019  . FACET JOINT INJECTION  2019  . KNEE ARTHROSCOPY WITH DRILLING/MICROFRACTURE Left 10/06/2013   Procedure: KNEE ARTHROSCOPY WITH DRILLING/MICROFRACTURE AND ALLOGRAFT CARTLAGE IMPLANTATION, CHONDRAPLASTY OF TROCHIAL AND PATELLA;  Surgeon: Cammy Copa,  MD;  Location: [facility] OR;  Service: Orthopedics;  Laterality: Left;  . KNEE SURGERY  1989   tendonitis, right knee;   . TUBAL LIGATION       The following portions of the patient's history were reviewed and updated as appropriate: allergies, current medications, past family history, past medical history, past social history, past surgical history and problem list.  ROS Otherwise as in subjective above    Objective: BP 120/80   Pulse 78   Temp 98.2 F (36.8 C)   Ht 5\' 1"  (1.549 m)   Wt 164 lb 11.2 oz (74.7 kg)   SpO2 97%   BMI 31.12 kg/m   General appearance: alert, no distress, well developed, well nourished Mild pain with right shoulder range of motion in general but no obvious deformity, no laxity, no swelling of the right shoulder, some pain noted with right lateral hip and hip range of motion although range of motion was full, no other obvious swelling of the joint, no obvious bony malformation in general of upper or lower extremities Neck supple nontender no lymphadenopathy no mass No lower extremity edema 2+ upper and lower extremity pulses Arms and legs neurovascularly intact, CN II through XII intact     Assessment: Encounter Diagnoses  Name Primary?  . Polyarthralgia Yes  . Bilateral shoulder pain, unspecified chronicity   . Right leg pain   . Leg swelling   . Pain in both wrists   . Morning stiffness of joints   . Fatigue, unspecified type      Plan: I reviewed her recent visit here. We discussed her current symptoms. Discussed screen for rheumatoid issue. She can continue her current medications for now for pain.  Soul was seen today for arthritis.  Diagnoses and all orders for this visit:  Polyarthralgia -     CBC with Differential/Platelet -     Comprehensive metabolic panel -     Sedimentation rate -     CYCLIC CITRUL PEPTIDE ANTIBODY, IGG/IGA -     Rheumatoid factor -     ANA  Bilateral shoulder pain, unspecified chronicity -     CBC with  Differential/Platelet -     Comprehensive metabolic panel -     Sedimentation rate -     CYCLIC CITRUL PEPTIDE ANTIBODY, IGG/IGA -     Rheumatoid factor -     ANA  Right leg pain -     CBC with Differential/Platelet -     Comprehensive metabolic panel -  Sedimentation rate -     CYCLIC CITRUL PEPTIDE ANTIBODY, IGG/IGA -     Rheumatoid factor -     ANA  Leg swelling -     CBC with Differential/Platelet -     Comprehensive metabolic panel -     Sedimentation rate -     CYCLIC CITRUL PEPTIDE ANTIBODY, IGG/IGA -     Rheumatoid factor -     ANA  Pain in both wrists -     CBC with Differential/Platelet -     Comprehensive metabolic panel -     Sedimentation rate -     CYCLIC CITRUL PEPTIDE ANTIBODY, IGG/IGA -     Rheumatoid factor -     ANA  Morning stiffness of joints -     CBC with Differential/Platelet -     Comprehensive metabolic panel -     Sedimentation rate -     CYCLIC CITRUL PEPTIDE ANTIBODY, IGG/IGA -     Rheumatoid factor -     ANA  Fatigue, unspecified type -     CBC with Differential/Platelet -     Comprehensive metabolic panel -     Sedimentation rate -     CYCLIC CITRUL PEPTIDE ANTIBODY, IGG/IGA -     Rheumatoid factor -     ANA    Follow up: pending labs

## 2019-08-04 ENCOUNTER — Other Ambulatory Visit: Payer: Self-pay | Admitting: Medical

## 2019-08-04 DIAGNOSIS — M2550 Pain in unspecified joint: Secondary | ICD-10-CM

## 2019-08-04 DIAGNOSIS — M25532 Pain in left wrist: Secondary | ICD-10-CM

## 2019-08-04 DIAGNOSIS — M2560 Stiffness of unspecified joint, not elsewhere classified: Secondary | ICD-10-CM

## 2019-08-04 DIAGNOSIS — M25531 Pain in right wrist: Secondary | ICD-10-CM

## 2019-08-04 LAB — COMPREHENSIVE METABOLIC PANEL
ALT: 13 IU/L (ref 0–32)
AST: 19 IU/L (ref 0–40)
Albumin/Globulin Ratio: 1.4 (ref 1.2–2.2)
Albumin: 4.4 g/dL (ref 3.8–4.9)
Alkaline Phosphatase: 66 IU/L (ref 39–117)
BUN/Creatinine Ratio: 13 (ref 9–23)
BUN: 11 mg/dL (ref 6–24)
Bilirubin Total: <0.2 mg/dL (ref 0.0–1.2)
CO2: 27 mmol/L (ref 20–29)
Calcium: 9.9 mg/dL (ref 8.7–10.2)
Chloride: 103 mmol/L (ref 96–106)
Creatinine, Ser: 0.87 mg/dL (ref 0.57–1.00)
GFR calc Af Amer: 89 mL/min/{1.73_m2} (ref >59)
GFR calc non Af Amer: 77 mL/min/{1.73_m2} (ref >59)
Globulin, Total: 3.1 g/dL (ref 1.5–4.5)
Glucose: 84 mg/dL (ref 65–99)
Potassium: 4.2 mmol/L (ref 3.5–5.2)
Sodium: 142 mmol/L (ref 134–144)
Total Protein: 7.5 g/dL (ref 6.0–8.5)

## 2019-08-04 LAB — CBC WITH DIFFERENTIAL/PLATELET
Basophils Absolute: 0 10*3/uL (ref 0.0–0.2)
Basos: 0 %
EOS (ABSOLUTE): 0.2 10*3/uL (ref 0.0–0.4)
Eos: 4 %
Hematocrit: 35.2 % (ref 34.0–46.6)
Hemoglobin: 11.7 g/dL (ref 11.1–15.9)
Immature Grans (Abs): 0 10*3/uL (ref 0.0–0.1)
Immature Granulocytes: 0 %
Lymphocytes Absolute: 2.3 10*3/uL (ref 0.7–3.1)
Lymphs: 43 %
MCH: 28.7 pg (ref 26.6–33.0)
MCHC: 33.2 g/dL (ref 31.5–35.7)
MCV: 86 fL (ref 79–97)
Monocytes Absolute: 0.5 10*3/uL (ref 0.1–0.9)
Monocytes: 8 %
Neutrophils Absolute: 2.4 10*3/uL (ref 1.4–7.0)
Neutrophils: 45 %
Platelets: 283 10*3/uL (ref 150–450)
RBC: 4.08 x10E6/uL (ref 3.77–5.28)
RDW: 12.3 % (ref 11.7–15.4)
WBC: 5.4 10*3/uL (ref 3.4–10.8)

## 2019-08-04 LAB — CYCLIC CITRUL PEPTIDE ANTIBODY, IGG/IGA: Cyclic Citrullin Peptide Ab: 5 units (ref 0–19)

## 2019-08-04 LAB — ANA: Anti Nuclear Antibody (ANA): NEGATIVE

## 2019-08-04 LAB — RHEUMATOID FACTOR: Rhuematoid fact SerPl-aCnc: <10 IU/mL (ref 0.0–13.9)

## 2019-08-04 LAB — SEDIMENTATION RATE: Sed Rate: 25 mm/hr (ref 0–40)

## 2019-08-04 MED ORDER — PREDNISONE 20 MG PO TABS: ORAL_TABLET | ORAL | 0 refills | Status: DC

## 2019-08-12 DIAGNOSIS — R5382 Chronic fatigue, unspecified: Secondary | ICD-10-CM | POA: Diagnosis not present

## 2019-08-12 DIAGNOSIS — M2550 Pain in unspecified joint: Secondary | ICD-10-CM | POA: Diagnosis not present

## 2019-08-17 ENCOUNTER — Telehealth: Payer: Self-pay | Admitting: Medical

## 2019-08-17 NOTE — Telephone Encounter (Signed)
When is her appointment to rheumatology.  See her email message.  I need to know how soon that will be so I can decide whether to put her on another round of prednisone or not

## 2019-08-17 NOTE — Telephone Encounter (Signed)
Patient saw Rheumatology on 08/12/19. Notes in proficient stated they will fax notes over once physician there signs it.

## 2019-08-20 ENCOUNTER — Other Ambulatory Visit: Payer: Self-pay | Admitting: Student

## 2019-08-20 DIAGNOSIS — G8929 Other chronic pain: Secondary | ICD-10-CM

## 2019-09-01 ENCOUNTER — Ambulatory Visit
Admission: RE | Admit: 2019-09-01 | Discharge: 2019-09-01 | Disposition: A | Discharge status: Home / Self Care | Payer: BC Managed Care – PPO | Source: Ambulatory Visit | Attending: Student | Admitting: Student

## 2019-09-01 ENCOUNTER — Other Ambulatory Visit: Payer: Self-pay

## 2019-09-01 DIAGNOSIS — G8929 Other chronic pain: Secondary | ICD-10-CM

## 2019-09-01 DIAGNOSIS — M545 Low back pain, unspecified: Secondary | ICD-10-CM

## 2019-09-01 MED ORDER — IOPAMIDOL (ISOVUE-M 200) INJECTION 41%
1.0000 mL | Freq: Once | INTRAMUSCULAR | Status: AC
  Administered 2019-09-01: 1 mL via INTRA_ARTICULAR

## 2019-09-01 MED ORDER — METHYLPREDNISOLONE ACETATE 40 MG/ML INJ SUSP (RADIOLOG
120.0000 mg | Freq: Once | INTRAMUSCULAR | Status: AC
  Administered 2019-09-01: 120 mg via INTRA_ARTICULAR

## 2019-09-01 NOTE — Discharge Instructions (Signed)

## 2019-10-20 ENCOUNTER — Other Ambulatory Visit: Payer: Self-pay | Admitting: Medical

## 2019-10-20 MED ORDER — HYDROCODONE-ACETAMINOPHEN 5-325 MG PO TABS: 1.0000 | ORAL_TABLET | Freq: Four times a day (QID) | ORAL | 0 refills | Status: DC | PRN

## 2019-11-11 DIAGNOSIS — M9901 Segmental and somatic dysfunction of cervical region: Secondary | ICD-10-CM | POA: Diagnosis not present

## 2019-11-11 DIAGNOSIS — M542 Cervicalgia: Secondary | ICD-10-CM | POA: Diagnosis not present

## 2019-11-11 DIAGNOSIS — M21751 Unequal limb length (acquired), right femur: Secondary | ICD-10-CM | POA: Diagnosis not present

## 2019-11-11 DIAGNOSIS — M546 Pain in thoracic spine: Secondary | ICD-10-CM | POA: Diagnosis not present

## 2019-11-11 DIAGNOSIS — M9902 Segmental and somatic dysfunction of thoracic region: Secondary | ICD-10-CM | POA: Diagnosis not present

## 2019-11-13 DIAGNOSIS — M9904 Segmental and somatic dysfunction of sacral region: Secondary | ICD-10-CM | POA: Diagnosis not present

## 2019-11-13 DIAGNOSIS — M9901 Segmental and somatic dysfunction of cervical region: Secondary | ICD-10-CM | POA: Diagnosis not present

## 2019-11-13 DIAGNOSIS — M9905 Segmental and somatic dysfunction of pelvic region: Secondary | ICD-10-CM | POA: Diagnosis not present

## 2019-11-13 DIAGNOSIS — M21751 Unequal limb length (acquired), right femur: Secondary | ICD-10-CM | POA: Diagnosis not present

## 2019-11-16 DIAGNOSIS — M9905 Segmental and somatic dysfunction of pelvic region: Secondary | ICD-10-CM | POA: Diagnosis not present

## 2019-11-16 DIAGNOSIS — M9901 Segmental and somatic dysfunction of cervical region: Secondary | ICD-10-CM | POA: Diagnosis not present

## 2019-11-16 DIAGNOSIS — M9904 Segmental and somatic dysfunction of sacral region: Secondary | ICD-10-CM | POA: Diagnosis not present

## 2019-11-16 DIAGNOSIS — M21751 Unequal limb length (acquired), right femur: Secondary | ICD-10-CM | POA: Diagnosis not present

## 2019-12-07 DIAGNOSIS — M5136 Other intervertebral disc degeneration, lumbar region: Secondary | ICD-10-CM | POA: Diagnosis not present

## 2019-12-07 DIAGNOSIS — M25551 Pain in right hip: Secondary | ICD-10-CM | POA: Diagnosis not present

## 2019-12-21 DIAGNOSIS — M5136 Other intervertebral disc degeneration, lumbar region: Secondary | ICD-10-CM | POA: Diagnosis not present

## 2019-12-28 ENCOUNTER — Encounter: Payer: Self-pay | Admitting: Family Medicine

## 2020-01-05 DIAGNOSIS — M5136 Other intervertebral disc degeneration, lumbar region: Secondary | ICD-10-CM | POA: Diagnosis not present

## 2020-01-06 ENCOUNTER — Other Ambulatory Visit (INDEPENDENT_AMBULATORY_CARE_PROVIDER_SITE_OTHER): Payer: BC Managed Care – PPO

## 2020-01-06 ENCOUNTER — Encounter: Payer: BC Managed Care – PPO | Admitting: Medical

## 2020-01-06 ENCOUNTER — Other Ambulatory Visit: Payer: Self-pay

## 2020-01-06 DIAGNOSIS — Z23 Encounter for immunization: Secondary | ICD-10-CM | POA: Diagnosis not present

## 2020-01-26 DIAGNOSIS — M5136 Other intervertebral disc degeneration, lumbar region: Secondary | ICD-10-CM | POA: Diagnosis not present

## 2020-02-01 DIAGNOSIS — M1712 Unilateral primary osteoarthritis, left knee: Secondary | ICD-10-CM | POA: Diagnosis not present

## 2020-02-15 DIAGNOSIS — G894 Chronic pain syndrome: Secondary | ICD-10-CM | POA: Diagnosis not present

## 2020-02-15 DIAGNOSIS — M47816 Spondylosis without myelopathy or radiculopathy, lumbar region: Secondary | ICD-10-CM | POA: Diagnosis not present

## 2020-02-17 DIAGNOSIS — M25562 Pain in left knee: Secondary | ICD-10-CM | POA: Diagnosis not present

## 2020-02-18 ENCOUNTER — Ambulatory Visit: Payer: BC Managed Care – PPO | Attending: Internal Medicine

## 2020-02-18 DIAGNOSIS — Z23 Encounter for immunization: Secondary | ICD-10-CM

## 2020-02-18 NOTE — Progress Notes (Signed)
   Covid-19 Vaccination Clinic  Name:  [name]    MRN: [id number] DOB: Aug 30, 1965  02/18/2020  [name] was observed post Covid-19 immunization for 15 minutes without incident. She was provided with Vaccine Information Sheet and instruction to access the V-Safe system.   [name] was instructed to call 911 with any severe reactions post vaccine: Marland Kitchen Difficulty breathing  . Swelling of face and throat  . A fast heartbeat  . A bad rash all over body  . Dizziness and weakness

## 2020-02-22 ENCOUNTER — Encounter: Payer: BC Managed Care – PPO | Admitting: Medical

## 2020-02-24 ENCOUNTER — Encounter: Payer: Self-pay | Admitting: Medical

## 2020-03-12 DIAGNOSIS — M47816 Spondylosis without myelopathy or radiculopathy, lumbar region: Secondary | ICD-10-CM | POA: Diagnosis not present

## 2020-04-13 DIAGNOSIS — G894 Chronic pain syndrome: Secondary | ICD-10-CM | POA: Diagnosis not present

## 2020-04-13 DIAGNOSIS — M5459 Other low back pain: Secondary | ICD-10-CM | POA: Diagnosis not present

## 2020-04-29 DIAGNOSIS — M25511 Pain in right shoulder: Secondary | ICD-10-CM | POA: Diagnosis not present

## 2020-04-29 DIAGNOSIS — M5412 Radiculopathy, cervical region: Secondary | ICD-10-CM | POA: Diagnosis not present

## 2020-05-07 DIAGNOSIS — M545 Low back pain, unspecified: Secondary | ICD-10-CM | POA: Diagnosis not present

## 2020-05-07 DIAGNOSIS — M542 Cervicalgia: Secondary | ICD-10-CM | POA: Diagnosis not present

## 2020-05-17 DIAGNOSIS — G894 Chronic pain syndrome: Secondary | ICD-10-CM | POA: Diagnosis not present

## 2020-05-17 DIAGNOSIS — M5030 Other cervical disc degeneration, unspecified cervical region: Secondary | ICD-10-CM | POA: Diagnosis not present

## 2020-05-17 DIAGNOSIS — M47816 Spondylosis without myelopathy or radiculopathy, lumbar region: Secondary | ICD-10-CM | POA: Diagnosis not present

## 2020-05-17 DIAGNOSIS — M5136 Other intervertebral disc degeneration, lumbar region: Secondary | ICD-10-CM | POA: Diagnosis not present

## 2020-05-23 ENCOUNTER — Other Ambulatory Visit: Payer: Self-pay

## 2020-05-23 ENCOUNTER — Ambulatory Visit: Payer: BC Managed Care – PPO | Admitting: Medical

## 2020-05-23 ENCOUNTER — Encounter: Payer: Self-pay | Admitting: Medical

## 2020-05-23 VITALS — BP 110/72 | HR 91 | Ht 61.0 in | Wt 163.6 lb

## 2020-05-23 DIAGNOSIS — Z13220 Encounter for screening for lipoid disorders: Secondary | ICD-10-CM | POA: Diagnosis not present

## 2020-05-23 DIAGNOSIS — M5441 Lumbago with sciatica, right side: Secondary | ICD-10-CM

## 2020-05-23 DIAGNOSIS — Z0000 Encounter for general adult medical examination without abnormal findings: Secondary | ICD-10-CM

## 2020-05-23 DIAGNOSIS — Z1159 Encounter for screening for other viral diseases: Secondary | ICD-10-CM | POA: Insufficient documentation

## 2020-05-23 DIAGNOSIS — Z23 Encounter for immunization: Secondary | ICD-10-CM

## 2020-05-23 DIAGNOSIS — M5442 Lumbago with sciatica, left side: Secondary | ICD-10-CM

## 2020-05-23 DIAGNOSIS — G8929 Other chronic pain: Secondary | ICD-10-CM | POA: Insufficient documentation

## 2020-05-23 DIAGNOSIS — Z6830 Body mass index (BMI) 30.0-30.9, adult: Secondary | ICD-10-CM | POA: Insufficient documentation

## 2020-05-23 LAB — POCT URINALYSIS DIP (PROADVANTAGE DEVICE)
Bilirubin, UA: NEGATIVE
Glucose, UA: NEGATIVE mg/dL
Ketones, POC UA: NEGATIVE mg/dL
Leukocytes, UA: NEGATIVE
Nitrite, UA: NEGATIVE
Protein Ur, POC: NEGATIVE mg/dL
Specific Gravity, Urine: 1.03
Urobilinogen, Ur: 0.2
pH, UA: 5.5

## 2020-05-23 MED ORDER — GABAPENTIN 100 MG PO CAPS: ORAL_CAPSULE | ORAL | 1 refills | Status: DC

## 2020-05-23 NOTE — Progress Notes (Signed)
Subjective:   HPI  [name] is a 55 y.o. female who presents for a complete physical.  Medical care team includes: [facility], Dr. Audelia Hives neurosurgery, Dr. Roxanne Gates OBGYN, Dr. Henderson Cloud Gastroenterology, Dr. Charna Elizabeth Bentli Llorente, [name], PA-C here for primary care   Concerns: Doing fine except for her chronic back pain issues.  Sees Dr. Ethelene Hal at [facility].  She has had rounds of physical therapy, nerve blocks, epidural steroid injections and still has pain.  Her current medication does not seem to be helping either.  Her last MRI was in January and there was slight bulges.  She has problems in the neck and low back.  Reviewed their medical, surgical, family, social, medication, and allergy history and updated chart as appropriate.  Past Medical History:  Diagnosis Date  . Allergy   . Atypical chest pain 02/11/2017  . Bursitis of shoulder, right   . Chronic back pain    since 2018  . GERD (gastroesophageal reflux disease)   . Heart murmur    as a child  . Knee pain, bilateral    intermittent pain, ran track in high school; OA  . Obesity   . Routine gynecological examination    Dr. Henderson Cloud  . Wisdom teeth extracted     Past Surgical History:  Procedure Laterality Date  . epidural steroid injection, lumbar spine  2019  . FACET JOINT INJECTION  2019  . KNEE ARTHROSCOPY WITH DRILLING/MICROFRACTURE Left 10/06/2013   Procedure: KNEE ARTHROSCOPY WITH DRILLING/MICROFRACTURE AND ALLOGRAFT CARTLAGE IMPLANTATION, CHONDRAPLASTY OF TROCHIAL AND PATELLA;  Surgeon: Cammy Copa, MD;  Location: [facility] OR;  Service: Orthopedics;  Laterality: Left;  . KNEE SURGERY  1989   tendonitis, right knee;   . TUBAL LIGATION      Social History   Socioeconomic History  . Marital status: Married    Spouse name: Not on file  . Number of children: Not on file  . Years of education: Not on file  . Highest education level: Not on file  Occupational History   . Not on file  Tobacco Use  . Smoking status: Never Smoker  . Smokeless tobacco: Never Used  Vaping Use  . Vaping Use: Never used  Substance and Sexual Activity  . Alcohol use: No  . Drug use: No  . Sexual activity: Not on file  Other Topics Concern  . 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  Hobbies - walking, makes jewelry, likes adult coloring books.  05/2020   Social Determinants of Health   Financial Resource Strain: Not on file  Food Insecurity: Not on file  Transportation Needs: Not on file  Physical Activity: Not on file  Stress: Not on file  Social Connections: Not on file  Intimate Partner Violence: Not on file    Family History  Problem Relation Age of Onset  . Hypertension Mother   . Kidney disease Mother        hypertensive kidney failure, renal transplant  . Diabetes Mother   . Heart disease Mother 36       CHF  . Heart failure Mother   . Stroke Mother   . Hypertension Father   . Sleep apnea Father   . Cancer Father        leukemia  . Fibroids Sister   .  Hypertension Sister      Current Outpatient Medications:  .  HYDROcodone-acetaminophen (NORCO) 5-325 MG tablet, Take 1 tablet by mouth every 6 (six) hours as needed., Disp: 12 tablet, Rfl: 0 .  meloxicam (MOBIC) 15 MG tablet, Take 15 mg by mouth daily., Disp: , Rfl:  .  methocarbamol (ROBAXIN) 500 MG tablet, methocarbamol 500 mg tablet  TAKE 1 TABLET 3 TIMES A DAY BY ORAL ROUTE AS NEEDED., Disp: , Rfl:  .  Multiple Vitamins-Minerals (HM MULTIVITAMIN ADULT GUMMY PO), Take 1 tablet by mouth daily., Disp: , Rfl:  .  gabapentin (NEURONTIN) 100 MG capsule, 1 capsule lunch , 2 capsules QHS, Disp: 90 capsule, Rfl: 1  No Known Allergies   Review of Systems Constitutional: -fever, -chills, -sweats,  -unexpected weight change, -decreased appetite, -fatigue Allergy: -sneezing, -itching, -congestion Dermatology: -changing moles, -rash, -lumps ENT: -runny nose, -ear pain, -sore throat, -hoarseness, -sinus pain, -teeth pain, - ringing in ears, -hearing loss, -nosebleeds Cardiology: -chest pain, -palpitations, -swelling, -difficulty breathing when lying flat, -waking up short of breath Respiratory: -cough, -shortness of breath, -difficulty breathing with exercise or exertion, -wheezing, -coughing up blood Gastroenterology: -abdominal pain, -nausea, -vomiting, -diarrhea, -constipation, -blood in stool, -changes in bowel movement, -difficulty swallowing or eating Hematology: -bleeding, -bruising  Musculoskeletal: -joint aches, -muscle aches, -joint swelling, +back pain, -neck pain, -cramping, -changes in gait Ophthalmology: denies vision changes, eye redness, itching, discharge Urology: -burning with urination, -difficulty urinating, -blood in urine, -urinary frequency, -urgency, -incontinence Neurology: -headache, -weakness, -tingling, -numbness, -memory loss, -falls, -dizziness Psychology: -depressed mood, -agitation, -sleep problems     Objective:   Physical Exam  BP 110/72   Pulse 91   Ht 5\' 1"  (1.549 m)   Wt 163 lb 9.6 oz (74.2 kg)   SpO2 98%   BMI 30.91 kg/m   Wt Readings from Last 3 Encounters:  05/23/20 163 lb 9.6 oz (74.2 kg)  07/31/19 164 lb 11.2 oz (74.7 kg)  07/21/19 160 lb 12.8 oz (72.9 kg)    General appearance: alert, no distress, WD/WN, AA female Skin: no worrisome lesions HEENT: normocephalic, conjunctiva/corneas normal, sclerae anicteric, PERRLA, EOMi, nares patent, no discharge or erythema, pharynx normal Oral cavity: MMM, no worrisome lesions Neck: supple, no lymphadenopathy, no thyromegaly, no masses, normal ROM, no bruits Chest: non tender, normal shape and expansion Heart: RRR, normal S1, S2, no murmurs Lungs: CTA bilaterally, no wheezes, rhonchi, or  rales Abdomen: +bs, soft, non tender, non distended, no masses, no hepatomegaly, no splenomegaly, no bruits Back: tender lumbar region, ROM limited due to pain, no scoliosis Musculoskeletal: upper extremities non tender, no obvious deformity, normal ROM throughout, lower extremities non tender, no obvious deformity, normal ROM throughout Extremities: no edema, no cyanosis, no clubbing Pulses: 2+ symmetric, upper and lower extremities, normal cap refill Neurological:  DTRs 1+ UE and LE, otherwise alert, oriented x 3, CN2-12 intact, strength normal upper extremities and lower extremities, sensation normal throughout, no cerebellar signs, gait normal Psychiatric: normal affect, behavior normal, pleasant  Breast/gyn/rectal - deferred to gyn   Assessment and Plan :    Encounter Diagnoses  Name Primary?  . Need for Tdap vaccination Yes  . Encounter for health maintenance examination in adult   . Encounter for hepatitis C screening test for low risk patient   . Screening for lipid disorders   . Chronic bilateral low back pain with bilateral sciatica    Today you had a preventative care visit or wellness visit.    Topics today may have included  healthy lifestyle, diet, exercise, preventative care, vaccinations, sick and well care, proper use of emergency dept and after hours care, as well as other concerns.     Recommendations: Continue to return yearly for your annual wellness and preventative care visits.  This gives us a chance to discuss healthy lifestyle, exercise, vaccinations, review your chart record, and perform screenings where appropriate.  I recommend you see your eye doctor yearly for routine vision care.  I recommend you see your dentist yearly for routine dental care including hygiene visits twice yearly.   Vaccination recommendations were reviewed You are up-to-date on COVID and flu vaccines  Shingles vaccine:  I recommend you have a shingles vaccine to help prevent  shingles or herpes zoster outbreak.   Please call your insurer to inquire about coverage for the Shingrix vaccine given in 2 doses.   Some insurers cover this vaccine after age 150, some cover this after age 54.  If your insurer covers this, then call to schedule appointment to have this vaccine here.  Counseled on the Tdap (tetanus, diptheria, and acellular pertussis) vaccine.  Vaccine information sheet given. Tdap vaccine given after consent obtained.    Screening for cancer: Breast cancer screening: You should perform a self breast exam monthly.   We reviewed recommendations for regular mammograms and breast cancer screening.  Colon cancer screening:  I reviewed your colonoscopy on file that is up to date from 2020  Cervical cancer screening: We reviewed recommendations for pap smear screening.  She is not up-to-date and plans to see gynecology soon for this  Skin cancer screening: Check your skin regularly for new changes, growing lesions, or other lesions of concern Come in for evaluation if you have skin lesions of concern.  Lung cancer screening: If you have a greater than 30 pack year history of tobacco use, then you qualify for lung cancer screening with a chest CT scan  We currently don't have screenings for other cancers besides breast, cervical, colon, and lung cancers.  If you have a strong family history of cancer or have other cancer screening concerns, please let me know.    Bone health: Get at least 150 minutes of aerobic exercise weekly Get weight bearing exercise at least once weekly   Heart health: Get at least 150 minutes of aerobic exercise weekly Limit alcohol It is important to maintain a healthy blood pressure and healthy cholesterol numbers   Separate significant issues discussed: Chronic pain in back and neck-increase gabapentin as below, and she will talk to her back doctor about other potential medication options both oral and topical versus  surgery  BMI greater than 30-discussed need for weight loss through healthy lifestyle changes  Aujanae was seen today for annual exam.  Diagnoses and all orders for this visit:  Need for Tdap vaccination  Encounter for health maintenance examination in adult -     Comprehensive metabolic panel -     CBC with Differential/Platelet -     Lipid panel -     VITAMIN D 25 Hydroxy (Vit-D Deficiency, Fractures) -     Hepatitis C antibody -     HIV Antibody (routine testing w rflx) -     POCT Urinalysis DIP (Proadvantage Device)  Encounter for hepatitis C screening test for low risk patient -     Hepatitis C antibody  Screening for lipid disorders -     Lipid panel  Chronic bilateral low back pain with bilateral sciatica  Other orders -  gabapentin (NEURONTIN) 100 MG capsule; 1 capsule lunch , 2 capsules QHS -     Tdap vaccine greater than or equal to 7yo IM     Follow-up pending labs, yearly for physical

## 2020-05-24 LAB — COMPREHENSIVE METABOLIC PANEL
ALT: 15 IU/L (ref 0–32)
AST: 22 IU/L (ref 0–40)
Albumin/Globulin Ratio: 1.4 (ref 1.2–2.2)
Albumin: 4.3 g/dL (ref 3.8–4.9)
Alkaline Phosphatase: 68 IU/L (ref 44–121)
BUN/Creatinine Ratio: 14 (ref 9–23)
BUN: 13 mg/dL (ref 6–24)
Bilirubin Total: <0.2 mg/dL (ref 0.0–1.2)
CO2: 22 mmol/L (ref 20–29)
Calcium: 9.8 mg/dL (ref 8.7–10.2)
Chloride: 106 mmol/L (ref 96–106)
Creatinine, Ser: 0.91 mg/dL (ref 0.57–1.00)
GFR calc Af Amer: 83 mL/min/{1.73_m2} (ref >59)
GFR calc non Af Amer: 72 mL/min/{1.73_m2} (ref >59)
Globulin, Total: 3 g/dL (ref 1.5–4.5)
Glucose: 86 mg/dL (ref 65–99)
Potassium: 4.1 mmol/L (ref 3.5–5.2)
Sodium: 143 mmol/L (ref 134–144)
Total Protein: 7.3 g/dL (ref 6.0–8.5)

## 2020-05-24 LAB — CBC WITH DIFFERENTIAL/PLATELET
Basophils Absolute: 0 10*3/uL (ref 0.0–0.2)
Basos: 0 %
EOS (ABSOLUTE): 0.2 10*3/uL (ref 0.0–0.4)
Eos: 4 %
Hematocrit: 36.7 % (ref 34.0–46.6)
Hemoglobin: 12.1 g/dL (ref 11.1–15.9)
Immature Grans (Abs): 0 10*3/uL (ref 0.0–0.1)
Immature Granulocytes: 0 %
Lymphocytes Absolute: 2 10*3/uL (ref 0.7–3.1)
Lymphs: 44 %
MCH: 28.5 pg (ref 26.6–33.0)
MCHC: 33 g/dL (ref 31.5–35.7)
MCV: 87 fL (ref 79–97)
Monocytes Absolute: 0.4 10*3/uL (ref 0.1–0.9)
Monocytes: 8 %
Neutrophils Absolute: 2 10*3/uL (ref 1.4–7.0)
Neutrophils: 44 %
Platelets: 286 10*3/uL (ref 150–450)
RBC: 4.24 x10E6/uL (ref 3.77–5.28)
RDW: 12.7 % (ref 11.7–15.4)
WBC: 4.6 10*3/uL (ref 3.4–10.8)

## 2020-05-24 LAB — HIV ANTIBODY (ROUTINE TESTING W REFLEX): HIV Screen 4th Generation wRfx: NONREACTIVE

## 2020-05-24 LAB — LIPID PANEL
Chol/HDL Ratio: 2.7 ratio (ref 0.0–4.4)
Cholesterol, Total: 202 mg/dL — ABNORMAL HIGH (ref 100–199)
HDL: 75 mg/dL (ref >39)
LDL Chol Calc (NIH): 110 mg/dL — ABNORMAL HIGH (ref 0–99)
Triglycerides: 98 mg/dL (ref 0–149)
VLDL Cholesterol Cal: 17 mg/dL (ref 5–40)

## 2020-05-24 LAB — VITAMIN D 25 HYDROXY (VIT D DEFICIENCY, FRACTURES): Vit D, 25-Hydroxy: 32.1 ng/mL (ref 30.0–100.0)

## 2020-05-24 LAB — HEPATITIS C ANTIBODY: Hep C Virus Ab: <0.1 s/co ratio (ref 0.0–0.9)

## 2020-06-02 DIAGNOSIS — M5136 Other intervertebral disc degeneration, lumbar region: Secondary | ICD-10-CM | POA: Diagnosis not present

## 2020-07-31 ENCOUNTER — Other Ambulatory Visit: Payer: Self-pay | Admitting: Medical

## 2020-08-04 DIAGNOSIS — M5416 Radiculopathy, lumbar region: Secondary | ICD-10-CM | POA: Diagnosis not present

## 2020-08-05 ENCOUNTER — Other Ambulatory Visit: Payer: Self-pay | Admitting: Student

## 2020-08-05 DIAGNOSIS — M5416 Radiculopathy, lumbar region: Secondary | ICD-10-CM

## 2020-08-10 ENCOUNTER — Other Ambulatory Visit: Payer: Self-pay

## 2020-08-10 ENCOUNTER — Ambulatory Visit
Admission: RE | Admit: 2020-08-10 | Discharge: 2020-08-10 | Disposition: A | Discharge status: Home / Self Care | Payer: BC Managed Care – PPO | Source: Ambulatory Visit | Attending: Student | Admitting: Student

## 2020-08-10 ENCOUNTER — Other Ambulatory Visit: Payer: Self-pay | Admitting: Student

## 2020-08-10 DIAGNOSIS — M5416 Radiculopathy, lumbar region: Secondary | ICD-10-CM

## 2020-08-10 DIAGNOSIS — Z01419 Encounter for gynecological examination (general) (routine) without abnormal findings: Secondary | ICD-10-CM | POA: Diagnosis not present

## 2020-08-10 DIAGNOSIS — Z1231 Encounter for screening mammogram for malignant neoplasm of breast: Secondary | ICD-10-CM | POA: Diagnosis not present

## 2020-08-10 DIAGNOSIS — M545 Low back pain, unspecified: Secondary | ICD-10-CM | POA: Diagnosis not present

## 2020-08-10 LAB — HM MAMMOGRAPHY

## 2020-08-10 NOTE — Discharge Instructions (Signed)

## 2020-08-23 ENCOUNTER — Other Ambulatory Visit: Payer: Self-pay | Admitting: Student

## 2020-08-23 DIAGNOSIS — M5416 Radiculopathy, lumbar region: Secondary | ICD-10-CM

## 2020-08-26 ENCOUNTER — Other Ambulatory Visit: Payer: Self-pay | Admitting: Student

## 2020-08-26 ENCOUNTER — Other Ambulatory Visit: Payer: Self-pay

## 2020-08-26 ENCOUNTER — Ambulatory Visit
Admission: RE | Admit: 2020-08-26 | Discharge: 2020-08-26 | Disposition: A | Discharge status: Home / Self Care | Payer: BC Managed Care – PPO | Source: Ambulatory Visit | Attending: Student | Admitting: Student

## 2020-08-26 DIAGNOSIS — M5416 Radiculopathy, lumbar region: Secondary | ICD-10-CM

## 2020-08-26 DIAGNOSIS — M545 Low back pain, unspecified: Secondary | ICD-10-CM | POA: Diagnosis not present

## 2020-08-26 MED ORDER — MIDAZOLAM HCL 2 MG/2ML IJ SOLN
1.0000 mg | INTRAMUSCULAR | Status: DC | PRN
  Administered 2020-08-26: 1 mg via INTRAVENOUS
  Administered 2020-08-26: 0.5 mg via INTRAVENOUS

## 2020-08-26 MED ORDER — METHYLPREDNISOLONE ACETATE 40 MG/ML INJ SUSP (RADIOLOG
80.0000 mg | Freq: Once | INTRAMUSCULAR | Status: AC
  Administered 2020-08-26: 80 mg via INTRALESIONAL

## 2020-08-26 MED ORDER — KETOROLAC TROMETHAMINE 30 MG/ML IJ SOLN
30.0000 mg | Freq: Once | INTRAMUSCULAR | Status: AC
  Administered 2020-08-26: 30 mg via INTRAVENOUS

## 2020-08-26 MED ORDER — SODIUM CHLORIDE 0.9 % IV SOLN: INTRAVENOUS | Status: DC

## 2020-08-26 MED ORDER — FENTANYL CITRATE (PF) 100 MCG/2ML IJ SOLN
25.0000 ug | INTRAMUSCULAR | Status: DC | PRN
  Administered 2020-08-26 (×2): 50 ug via INTRAVENOUS

## 2020-08-26 NOTE — Discharge Instructions (Signed)
Radio Frequency Ablation Post Procedure Discharge Instructions ° °1. May resume a regular diet and any medications that you routinely take (including pain medications). °2. No driving day of procedure. °3. Upon discharge go home and rest for at least 4 hours.  May use an ice pack as needed to injection sites on back. °4. Remove bandades later, today. ° ° ° °Please contact our office at [phone] for the following symptoms: ° °· Fever greater than 100 degrees °· Increased swelling, pain, or redness at injection site. ° ° °Thank you for visiting [facility]. °

## 2020-10-19 DIAGNOSIS — M25552 Pain in left hip: Secondary | ICD-10-CM | POA: Diagnosis not present

## 2020-11-04 DIAGNOSIS — Z03818 Encounter for observation for suspected exposure to other biological agents ruled out: Secondary | ICD-10-CM | POA: Diagnosis not present

## 2020-11-04 DIAGNOSIS — Z20822 Contact with and (suspected) exposure to covid-19: Secondary | ICD-10-CM | POA: Diagnosis not present

## 2020-11-09 DIAGNOSIS — M25552 Pain in left hip: Secondary | ICD-10-CM | POA: Diagnosis not present

## 2020-11-15 DIAGNOSIS — M1612 Unilateral primary osteoarthritis, left hip: Secondary | ICD-10-CM | POA: Diagnosis not present

## 2020-11-15 DIAGNOSIS — S76312D Strain of muscle, fascia and tendon of the posterior muscle group at thigh level, left thigh, subsequent encounter: Secondary | ICD-10-CM | POA: Diagnosis not present

## 2020-11-18 ENCOUNTER — Encounter: Payer: Self-pay | Admitting: Internal Medicine

## 2020-11-30 DIAGNOSIS — M25552 Pain in left hip: Secondary | ICD-10-CM | POA: Diagnosis not present

## 2020-12-01 ENCOUNTER — Encounter: Payer: Self-pay | Admitting: Medical

## 2021-01-19 DIAGNOSIS — G8929 Other chronic pain: Secondary | ICD-10-CM | POA: Diagnosis not present

## 2021-01-19 DIAGNOSIS — M25552 Pain in left hip: Secondary | ICD-10-CM | POA: Diagnosis not present

## 2021-02-06 ENCOUNTER — Telehealth: Payer: BC Managed Care – PPO | Admitting: Medical

## 2021-02-06 ENCOUNTER — Other Ambulatory Visit (INDEPENDENT_AMBULATORY_CARE_PROVIDER_SITE_OTHER): Payer: BC Managed Care – PPO

## 2021-02-06 ENCOUNTER — Other Ambulatory Visit: Payer: Self-pay

## 2021-02-06 VITALS — Temp 97.6°F | Wt 155.0 lb

## 2021-02-06 DIAGNOSIS — R0600 Dyspnea, unspecified: Secondary | ICD-10-CM | POA: Diagnosis not present

## 2021-02-06 DIAGNOSIS — R059 Cough, unspecified: Secondary | ICD-10-CM

## 2021-02-06 LAB — POC COVID19 BINAXNOW: SARS Coronavirus 2 Ag: NEGATIVE

## 2021-02-06 LAB — POCT INFLUENZA A/B
Influenza A, POC: NEGATIVE
Influenza B, POC: NEGATIVE

## 2021-02-06 MED ORDER — HYDROCOD POLST-CPM POLST ER 10-8 MG/5ML PO SUER: 5.0000 mL | Freq: Two times a day (BID) | ORAL | 0 refills | Status: DC

## 2021-02-06 NOTE — Progress Notes (Signed)
Subjective:     Patient ID: Carrie [name], female   DOB: December 17, 1965, 55 y.o.   MRN: [id number]  This visit type was conducted due to national recommendations for restrictions regarding the COVID-19 Pandemic (e.g. social distancing) in an effort to limit this patient's exposure and mitigate transmission in our community.  Due to their co-morbid illnesses, this patient is at least at moderate risk for complications without adequate follow up.  This format is felt to be most appropriate for this patient at this time.    Documentation for virtual audio and video telecommunications through [place] encounter:  The patient was located at home. The provider was located in the office. The patient did consent to this visit and is aware of possible charges through their insurance for this visit.  The other persons participating in this telemedicine service were none. Time spent on call was 20 minutes and in review of previous records 20 minutes total.  This virtual service is not related to other E/M service within previous 7 days.   HPI Chief Complaint  Patient presents with   Cough    Cough, x2 days, and chest pain when coughing   Virtual consult for illness.   She notes 2 day hx/o cough, chest hurting from cough.  [name], [name], [name].   Head hurts, but doeesn't feel stuffy.   Has some aches, but [name] chills.  Getting some mucous with cough.   Feels a little winded with cough fits.   [name] wheezing.  Had covid test yesterday that was negative.    Grandchild was sick last week with fever, cough.  Carrie [name] is 55yo.  He was negative for flu and covid, and negative for RSV reportedly.  He had a croupy cough.    Using some mucinex.    [name] heartburn or GERD, [name] recent spicy foods.  [name] history of asthma or lung disease.    [name] other aggravating or relieving factors. [name] other complaint.   Past Medical History:  Diagnosis Date   Allergy    Atypical chest pain 02/11/2017    Bursitis of shoulder, right    Chronic back pain    since 2018   GERD (gastroesophageal reflux disease)    Heart murmur    as a child   Knee pain, bilateral    intermittent pain, ran track in high school; OA   Obesity    Routine gynecological examination    Dr. Henderson Cloud   Wisdom teeth extracted    Current Outpatient Medications on File Prior to Visit  Medication Sig Dispense Refill   gabapentin (NEURONTIN) 100 MG capsule TAKE 1 CAPSULE WITH LUNCH AND 2 CAPSULES AT BEDTIME 90 capsule 0   meloxicam (MOBIC) 15 MG tablet Take 15 mg by mouth daily.     Multiple Vitamins-Minerals (HM MULTIVITAMIN ADULT GUMMY PO) Take 1 tablet by mouth daily.     [name] current facility-administered medications on file prior to visit.    Review of Systems As in subjective    Objective:   Physical Exam Due to coronavirus pandemic stay at home measures, patient visit was virtual and they were not examined in person.   Temp 97.6 F (36.4 C)   Wt 155 lb (70.3 kg)   BMI 29.29 kg/m   Gen: Well-developed, well-nourished Although she reports [name] congestion, she sounds congested on the phone and is having quite a bit of cough [name] labored breathing or wheezing but she does have to stop talking for  a minute or 2 after coughing fits    Assessment:     Encounter Diagnoses  Name Primary?   Cough, unspecified type Yes   Dyspnea, unspecified type        Plan:     Discussed symptoms, concerns, limitations of virtual consult.  Based on symptoms and exposure to her grandchild last week in daycare that had a croupy cough, we discussed possible viral illnesses.  Cannot rule out certain infection based on virtual consult alone.  Offered chest x-ray but she declines.  She will come into our back office this morning for other screening for COVID and flu  Advised hydration, rest, Tylenol for fever or aches, salt water gargles for sore throat, can use the Mucinex she is using for mucus over-the-counter, can add  Tussionex as below for worse cough.  Caution on sedation with this medication  If much worse in the coming days or not seeing improvements over the course of this week then call or recheck  Carrie [name] was seen today for cough.  Diagnoses and all orders for this visit:  Cough, unspecified type  Dyspnea, unspecified type  Follow-up in our back parking lot this morning for testing

## 2021-02-07 LAB — SARS-COV-2, NAA 2 DAY TAT

## 2021-02-07 LAB — NOVEL CORONAVIRUS, NAA: SARS-CoV-2, NAA: NOT DETECTED

## 2021-02-23 ENCOUNTER — Other Ambulatory Visit: Payer: Self-pay

## 2021-02-23 ENCOUNTER — Ambulatory Visit (INDEPENDENT_AMBULATORY_CARE_PROVIDER_SITE_OTHER): Payer: BC Managed Care – PPO | Admitting: Medical

## 2021-02-23 ENCOUNTER — Encounter: Payer: Self-pay | Admitting: Medical

## 2021-02-23 VITALS — BP 124/70 | HR 80 | Wt 156.0 lb

## 2021-02-23 DIAGNOSIS — R350 Frequency of micturition: Secondary | ICD-10-CM | POA: Diagnosis not present

## 2021-02-23 DIAGNOSIS — R3915 Urgency of urination: Secondary | ICD-10-CM

## 2021-02-23 DIAGNOSIS — R059 Cough, unspecified: Secondary | ICD-10-CM | POA: Diagnosis not present

## 2021-02-23 DIAGNOSIS — Z23 Encounter for immunization: Secondary | ICD-10-CM

## 2021-02-23 LAB — POCT URINALYSIS DIP (PROADVANTAGE DEVICE)
Bilirubin, UA: NEGATIVE
Blood, UA: NEGATIVE
Glucose, UA: NEGATIVE mg/dL
Ketones, POC UA: NEGATIVE mg/dL
Leukocytes, UA: NEGATIVE
Nitrite, UA: NEGATIVE
Protein Ur, POC: NEGATIVE mg/dL
Specific Gravity, Urine: 1.02
Urobilinogen, Ur: 0.2
pH, UA: 6

## 2021-02-23 MED ORDER — HYDROCOD POLST-CPM POLST ER 10-8 MG/5ML PO SUER: 5.0000 mL | Freq: Two times a day (BID) | ORAL | 0 refills | Status: DC

## 2021-02-23 NOTE — Progress Notes (Signed)
Subjective:  [name] is a 55 y.o. female who presents for Chief Complaint  Patient presents with   Hematuria    Noticed Monday, increased urinary urgency     Here for recent episode of blood in urine.  4 days ago noticed slight bit of blood in urine on tissue and pink color in toilet bowl.  Never had discomfort or pain.  Has had some urgency and frequency of urine this week.  No fever, no body aches or chills, no NVD, no vaginal discharge.    LMP - 20 years ago.  Still has uterus.  No pelvic cramping, no pain with intercourse.  She notes normal pap 05/2020 with gynecology, [name].  Here for flu shot today  She has some lingering cough where she had the flu.  Her whole household had influenza virus a few weeks ago.  She has mostly resolved but still has lingering cough.  No fever, no body aches or chills.  No wheezing or shortness of breath  No other aggravating or relieving factors.    No other c/o.  Past Medical History:  Diagnosis Date   Allergy    Atypical chest pain 02/11/2017   Bursitis of shoulder, right    Chronic back pain    since 2018   GERD (gastroesophageal reflux disease)    Heart murmur    as a child   Knee pain, bilateral    intermittent pain, ran track in high school; OA   Obesity    Routine gynecological examination    Dr. Henderson Cloud   Wisdom teeth extracted    Current Outpatient Medications on File Prior to Visit  Medication Sig Dispense Refill   gabapentin (NEURONTIN) 100 MG capsule TAKE 1 CAPSULE WITH LUNCH AND 2 CAPSULES AT BEDTIME 90 capsule 0   meloxicam (MOBIC) 15 MG tablet Take 15 mg by mouth daily.     Multiple Vitamins-Minerals (HM MULTIVITAMIN ADULT GUMMY PO) Take 1 tablet by mouth daily.     No current facility-administered medications on file prior to visit.   Past Surgical History:  Procedure Laterality Date   epidural steroid injection, lumbar spine  2019   FACET JOINT INJECTION  2019   KNEE ARTHROSCOPY WITH  DRILLING/MICROFRACTURE Left 10/06/2013   Procedure: KNEE ARTHROSCOPY WITH DRILLING/MICROFRACTURE AND ALLOGRAFT CARTLAGE IMPLANTATION, CHONDRAPLASTY OF TROCHIAL AND PATELLA;  Surgeon: Cammy Copa, MD;  Location: [facility] OR;  Service: Orthopedics;  Laterality: Left;   KNEE SURGERY  1989   tendonitis, right knee;    TUBAL LIGATION       The following portions of the patient's history were reviewed and updated as appropriate: allergies, current medications, past family history, past medical history, past social history, past surgical history and problem list.  ROS Otherwise as in subjective above  Objective: BP 124/70 (BP Location: Left Arm, Patient Position: Sitting)   Pulse 80   Wt 156 lb (70.8 kg)   SpO2 98%   BMI 29.48 kg/m   General appearance: alert, no distress, well developed, well nourished Neck: supple, no lymphadenopathy, no thyromegaly, no masses Heart: RRR, normal S1, S2, no murmurs Lungs: CTA bilaterally, no wheezes, rhonchi, or rales Abdomen: +bs, soft, non tender, non distended, no masses, no hepatomegaly, no splenomegaly Back: no cva tenderness Pulses: 2+ radial pulses, 2+ pedal pulses, normal cap refill Ext: no edema     Assessment: Encounter Diagnoses  Name Primary?   Urine frequency Yes   Urgency of urination    Need for influenza  vaccination    Cough, unspecified type      Plan: Urine frequency, urination urgency, 1 day 1 episode of brief blood sighting In the urine.  Symptoms resolved.  Urinalysis normal today.  At this point she wants to use a watch and wait approach.  Offered pelvic exam, urine culture.,  She declines for now.  I reviewed her labs from earlier in the year with normal kidney function.  Advise she cut back on caffeine and meloxicam the next several days.  Hydrate well with water throughout the day.  If recurrent symptoms or new symptoms over the next several days then call or recheck  Lingering cough, flu illness a few weeks ago.   Mostly improved.  Refill cough syrup for as needed use  Counseled on the influenza virus vaccine.  Vaccine information sheet given.  Influenza vaccine given after consent obtained.   [name] was seen today for hematuria.  Diagnoses and all orders for this visit:  Urine frequency -     POCT Urinalysis DIP (Proadvantage Device)  Urgency of urination -     POCT Urinalysis DIP (Proadvantage Device)  Need for influenza vaccination -     Flu Vaccine QUAD 26mo+IM (Fluarix, Fluzone & Alfiuria Quad PF)  Cough, unspecified type  Other orders -     chlorpheniramine-HYDROcodone (TUSSIONEX PENNKINETIC ER) 10-8 MG/5ML SUER; Take 5 mLs by mouth 2 (two) times daily.   Follow up: prn

## 2021-03-08 ENCOUNTER — Encounter: Payer: Self-pay | Admitting: Podiatry

## 2021-03-08 ENCOUNTER — Other Ambulatory Visit: Payer: Self-pay

## 2021-03-08 ENCOUNTER — Ambulatory Visit (INDEPENDENT_AMBULATORY_CARE_PROVIDER_SITE_OTHER): Payer: BC Managed Care – PPO

## 2021-03-08 ENCOUNTER — Ambulatory Visit: Payer: BC Managed Care – PPO | Admitting: Podiatry

## 2021-03-08 DIAGNOSIS — M722 Plantar fascial fibromatosis: Secondary | ICD-10-CM | POA: Diagnosis not present

## 2021-03-08 DIAGNOSIS — M7752 Other enthesopathy of left foot: Secondary | ICD-10-CM

## 2021-03-08 DIAGNOSIS — M779 Enthesopathy, unspecified: Secondary | ICD-10-CM

## 2021-03-08 MED ORDER — TRIAMCINOLONE ACETONIDE 10 MG/ML IJ SUSP
20.0000 mg | Freq: Once | INTRAMUSCULAR | Status: AC
  Administered 2021-03-08: 20 mg

## 2021-03-08 NOTE — Progress Notes (Signed)
Subjective:   Patient ID: [name], female   DOB: 55 y.o.   MRN: [id number]   HPI Patient states she is developed a lot of pain in the bottom of both feet with the ball of the left foot hurting in the right heel.  States that they both have been giving her problems and making it hard to walk and its been going on a short period of time but feels very tender for her and makes it hard to be active   ROS      Objective:  Physical Exam  Neurovascular status intact with patient found to have inflammation around the second MPJ left with fluid buildup and pain with inflammation around the right plantar heel at the insertional point of the tendon into the calcaneus     Assessment:  Acute capsulitis second MPJ left with acute plantar fasciitis right inflammation fluid noted in both areas     Plan:  NP reviewed both conditions.  For the left I did forefoot block 60 mg like Marcaine mixture sterile prep of the joint aspirated the second MPJ getting out a small amount of clear fluid injected quarter cc dexamethasone Kenalog.  For the right sterile prep done and injected the plantar fascia 3 mg Kenalog 5 mg Xylocaine advised on support physical therapy and different stretching activities.  Reappoint to recheck as needed  X-rays indicate no signs of arthritis or stress fracture around the second metatarsal with the right plantar heel showing small spur no indications of stress fracture

## 2021-04-09 HISTORY — PX: TOTAL HIP ARTHROPLASTY: SHX124

## 2021-04-21 DIAGNOSIS — M160 Bilateral primary osteoarthritis of hip: Secondary | ICD-10-CM | POA: Diagnosis not present

## 2021-04-22 ENCOUNTER — Other Ambulatory Visit: Payer: Self-pay

## 2021-04-22 ENCOUNTER — Encounter (HOSPITAL_COMMUNITY): Payer: Self-pay

## 2021-04-22 ENCOUNTER — Emergency Department (HOSPITAL_COMMUNITY): Payer: BC Managed Care – PPO

## 2021-04-22 ENCOUNTER — Emergency Department (HOSPITAL_COMMUNITY)
Admission: EM | Admit: 2021-04-22 | Discharge: 2021-04-22 | Disposition: A | Discharge status: Home / Self Care | Payer: BC Managed Care – PPO | Attending: Emergency Medicine | Admitting: Emergency Medicine

## 2021-04-22 DIAGNOSIS — S8991XA Unspecified injury of right lower leg, initial encounter: Secondary | ICD-10-CM | POA: Insufficient documentation

## 2021-04-22 DIAGNOSIS — M1711 Unilateral primary osteoarthritis, right knee: Secondary | ICD-10-CM | POA: Diagnosis not present

## 2021-04-22 DIAGNOSIS — Y92009 Unspecified place in unspecified non-institutional (private) residence as the place of occurrence of the external cause: Secondary | ICD-10-CM | POA: Insufficient documentation

## 2021-04-22 DIAGNOSIS — X509XXA Other and unspecified overexertion or strenuous movements or postures, initial encounter: Secondary | ICD-10-CM | POA: Diagnosis not present

## 2021-04-22 DIAGNOSIS — M25561 Pain in right knee: Secondary | ICD-10-CM | POA: Diagnosis not present

## 2021-04-22 DIAGNOSIS — M25461 Effusion, right knee: Secondary | ICD-10-CM | POA: Diagnosis not present

## 2021-04-22 MED ORDER — OXYCODONE-ACETAMINOPHEN 5-325 MG PO TABS
1.0000 | ORAL_TABLET | Freq: Four times a day (QID) | ORAL | 0 refills | Status: DC | PRN
Start: 2021-04-22 — End: 2021-08-08

## 2021-04-22 MED ORDER — OXYCODONE-ACETAMINOPHEN 5-325 MG PO TABS
1.0000 | ORAL_TABLET | Freq: Four times a day (QID) | ORAL | 0 refills | Status: DC | PRN
Start: 2021-04-22 — End: 2021-04-22

## 2021-04-22 MED ORDER — HYDROMORPHONE HCL 1 MG/ML IJ SOLN
0.5000 mg | Freq: Once | INTRAMUSCULAR | Status: AC
  Administered 2021-04-22: 0.5 mg via INTRAVENOUS
  Filled 2021-04-22: qty 1

## 2021-04-22 MED ORDER — ONDANSETRON HCL 4 MG/2ML IJ SOLN
4.0000 mg | Freq: Once | INTRAMUSCULAR | Status: AC
  Administered 2021-04-22: 4 mg via INTRAVENOUS
  Filled 2021-04-22: qty 2

## 2021-04-22 MED ORDER — FENTANYL CITRATE PF 50 MCG/ML IJ SOSY
100.0000 ug | PREFILLED_SYRINGE | Freq: Once | INTRAMUSCULAR | Status: AC
  Administered 2021-04-22: 100 ug via INTRAVENOUS
  Filled 2021-04-22: qty 2

## 2021-04-22 MED ORDER — OXYCODONE-ACETAMINOPHEN 5-325 MG PO TABS
1.0000 | ORAL_TABLET | Freq: Once | ORAL | Status: AC
  Administered 2021-04-22: 1 via ORAL
  Filled 2021-04-22: qty 1

## 2021-04-22 NOTE — ED Triage Notes (Signed)
Patient present with c/o right knee pain. Patient report she was on a ladder and she heard knee pop while on the ladder. Patient state she did not have a fall. Patient was given fentanyl 100 mcg per ems.

## 2021-04-22 NOTE — ED Provider Notes (Signed)
[facility]-EMERGENCY DEPT Provider Note   CSN: [id number] Arrival date & time: 04/22/21  1748     History  No chief complaint on file.   [name] is a 56 y.o. female.  Patient presents to the emergency department for evaluation of cute onset of right knee pain.  Patient was stepping up a stepladder in her house and when pushing off with her left leg felt a pop in her right knee.  She did not fall or hit her head.  She has had pain and difficulty moving her knee since that time.  No hip or ankle pain.  EMS was called for transport.  Patient was given 100 mcg of fentanyl in route.  Patient denies headache, numbness or tingling.      Home Medications Prior to Admission medications   Medication Sig Start Date End Date Taking? Authorizing Provider  chlorpheniramine-HYDROcodone (TUSSIONEX PENNKINETIC ER) 10-8 MG/5ML SUER Take 5 mLs by mouth 2 (two) times daily. 02/23/21   Tysinger, [name], PA-C  gabapentin (NEURONTIN) 100 MG capsule TAKE 1 CAPSULE WITH LUNCH AND 2 CAPSULES AT BEDTIME 08/01/20   Tysinger, [name], PA-C  meloxicam (MOBIC) 15 MG tablet Take 15 mg by mouth daily. 05/06/20   [provider]  Multiple Vitamins-Minerals (HM MULTIVITAMIN ADULT GUMMY PO) Take 1 tablet by mouth daily.    [provider]      Allergies    Patient has no known allergies.    Review of Systems   Review of Systems  Physical Exam Updated Vital Signs BP 109/66    Pulse 66    Temp 97.8 F (36.6 C) (Oral)    Resp 18    Ht 5\' 1"  (1.549 m)    Wt 72.6 kg    SpO2 97%    BMI 30.23 kg/m  Physical Exam Vitals and nursing note reviewed.  Constitutional:      Appearance: She is well-developed.  HENT:     Head: Normocephalic and atraumatic.  Eyes:     Pupils: Pupils are equal, round, and reactive to light.  Cardiovascular:     Pulses: Normal pulses. No decreased pulses.          Dorsalis pedis pulses are 2+ on the right side.       Posterior tibial  pulses are 2+ on the right side.  Musculoskeletal:        General: Tenderness present.     Cervical back: Normal range of motion and neck supple.     Comments: Right lower extremity: Patient is able to wiggle her toes.  Normal distal pulses.  Normal sensation intact.  Calf is soft and nontender without signs of hematoma or compartment syndrome.  The patella is displaced superiorly and the fossa of the anterior knee is readily palpated.  Patient has decreased range of motion and is unable to flex or extend at the knee.  Clinical concern for patellar tendon rupture.  Quadriceps and thigh nontender.  Skin:    General: Skin is warm and dry.  Neurological:     Mental Status: She is alert.     Sensory: No sensory deficit.     Comments: Motor, sensation, and vascular distal to the injury is fully intact.   Psychiatric:        Mood and Affect: Mood normal.    ED Results / Procedures / Treatments   Labs (all labs ordered are listed, but only abnormal results are displayed) Labs Reviewed - No data  to display  EKG None  Radiology DG Knee Complete 4 Views Right  Result Date: 04/22/2021 CLINICAL DATA:  Concern for patellar ligament rupture. EXAM: RIGHT KNEE - COMPLETE 4+ VIEW COMPARISON:  None. FINDINGS: No evidence of fracture. Mild superior displacement and abnormal angulation of the patella. Three compartment osteoarthritic changes of the right knee. No significant suprapatellar effusion is seen. IMPRESSION: 1. Mild superior displacement and significant abnormal angulation of the patella, may represent patellar tendon injury. 2. Three compartment osteoarthritic changes of the right knee. Electronically Signed   By: Ted Mcalpine M.D.   On: 04/22/2021 18:51    Procedures Procedures    Medications Ordered in ED Medications  HYDROmorphone (DILAUDID) injection 0.5 mg (0.5 mg Intravenous Given 04/22/21 1832)  ondansetron (ZOFRAN) injection 4 mg (4 mg Intravenous Given 04/22/21 1832)   fentaNYL (SUBLIMAZE) injection 100 mcg (100 mcg Intravenous Given 04/22/21 1951)  oxyCODONE-acetaminophen (PERCOCET/ROXICET) 5-325 MG per tablet 1 tablet (1 tablet Oral Given 04/22/21 1949)    ED Course/ Medical Decision Making/ A&P    Patient seen and examined. History obtained directly from patient.     Imaging: Right knee films ordered  Medications/Fluids: IV Dilaudid, IV Zofran ordered.  Most recent vital signs reviewed and are as follows: BP 109/66    Pulse 66    Temp 97.8 F (36.6 C) (Oral)    Resp 18    Ht 5\' 1"  (1.549 m)    Wt 72.6 kg    SpO2 97%    BMI 30.23 kg/m   Initial impression: Knee pain, possible patella tendon rupture  X-ray personally reviewed and interpreted.  Agree with high riding patella with abnormal angulation.  I discussed case with Dr. who recommends crutches, knee immobilizer, patient able to weight-bear to help with stability.  Asked for MRI if possible.  Otherwise patient will follow-up next week.  Reassessment performed. Patient appears more comfortable.  We discussed orthopedic referral and follow-up.  Discussed MRI and will try to obtain this.  Patient's been placed in a knee immobilizer and crutches provided.  Most current vital signs reviewed and are as follows: BP 122/84    Pulse 62    Temp 97.8 F (36.6 C) (Oral)    Resp 18    Ht 5\' 1"  (1.549 m)    Wt 72.6 kg    SpO2 100%    BMI 30.23 kg/m   Plan: P.o. Percocet, MRI, discharge  10:27 PM MRI completed.  Patient to be discharged.  Follow-up plan as above.                          Medical Decision Making  Patient with knee injury and exam concerning for patellar tendon injury.  Considered meniscal injury, sprain, dislocation, tibia, proximal fibula, or femur fracture on differential.  Lower extremity is neurovascularly intact during ED stay.  No signs of compartment syndrome.  Pain controlled.  Home with orthopedic follow-up.        Final Clinical Impression(s) / ED Diagnoses Final  diagnoses:  Injury of right knee, initial encounter    Rx / DC Orders ED Discharge Orders          Ordered    oxyCODONE-acetaminophen (PERCOCET/ROXICET) 5-325 MG tablet  Every 6 hours PRN        04/22/21 2223              04/24/21, PA-C 04/22/21 2229    Tegeler, 04/24/21, MD 04/22/21 2342

## 2021-04-22 NOTE — Discharge Instructions (Addendum)
Please read and follow all provided instructions.  Your diagnoses today include:  1. Injury of right knee, initial encounter     Tests performed today include: An x-ray of the affected area - does NOT show any broken bones, but patella positioning concerning for tendon injury Vital signs. See below for your results today.   Medications prescribed:  Percocet (oxycodone/acetaminophen) - narcotic pain medication  DO NOT drive or perform any activities that require you to be awake and alert because this medicine can make you drowsy. BE VERY CAREFUL not to take multiple medicines containing Tylenol (also called acetaminophen). Doing so can lead to an overdose which can damage your liver and cause liver failure and possibly death.  Take any prescribed medications only as directed.  Home care instructions:  Follow any educational materials contained in this packet Use crutches when walking, you may weight bear as able with your right leg Follow R.I.C.E. Protocol: R - rest your injury  I  - use ice on injury without applying directly to skin C - compress injury with bandage or splint E - elevate the injury as much as possible  Follow-up instructions: Call Dr. Warren Danes office on Monday for follow-up appointment. If you have a tendon rupture, this may require surgery.   Return instructions:  Please return if your toes or feet are numb or tingling, appear gray or blue, or you have severe pain (also elevate the leg and loosen splint or wrap if you were given one) Please return to the Emergency Department if you experience worsening symptoms.  Please return if you have any other emergent concerns.  Additional Information:  Your vital signs today were: BP 106/73    Pulse 62    Temp 97.8 F (36.6 C) (Oral)    Resp 18    Ht 5\' 1"  (1.549 m)    Wt 72.6 kg    SpO2 98%    BMI 30.23 kg/m  If your blood pressure (BP) was elevated above 135/85 this visit, please have this repeated by your doctor within  one month. --------------

## 2021-04-25 DIAGNOSIS — M25561 Pain in right knee: Secondary | ICD-10-CM | POA: Diagnosis not present

## 2021-04-25 DIAGNOSIS — M25461 Effusion, right knee: Secondary | ICD-10-CM | POA: Diagnosis not present

## 2021-04-28 ENCOUNTER — Ambulatory Visit: Payer: BC Managed Care – PPO | Admitting: Orthopaedic Surgery

## 2021-05-03 DIAGNOSIS — M25552 Pain in left hip: Secondary | ICD-10-CM | POA: Diagnosis not present

## 2021-05-03 DIAGNOSIS — M25551 Pain in right hip: Secondary | ICD-10-CM | POA: Diagnosis not present

## 2021-05-26 ENCOUNTER — Encounter: Payer: BC Managed Care – PPO | Admitting: Medical

## 2021-05-26 ENCOUNTER — Telehealth: Payer: Self-pay | Admitting: Medical

## 2021-05-26 DIAGNOSIS — Z0000 Encounter for general adult medical examination without abnormal findings: Secondary | ICD-10-CM

## 2021-05-26 NOTE — Telephone Encounter (Signed)

## 2021-05-29 ENCOUNTER — Encounter: Payer: Self-pay | Admitting: Medical

## 2021-05-29 NOTE — Telephone Encounter (Signed)
Done

## 2021-06-19 DIAGNOSIS — M160 Bilateral primary osteoarthritis of hip: Secondary | ICD-10-CM | POA: Diagnosis not present

## 2021-06-21 ENCOUNTER — Telehealth: Payer: Self-pay | Admitting: Medical

## 2021-06-21 NOTE — Telephone Encounter (Signed)
Schedule for yearly fasting physical / surgery preop.  I received a request from [facility] for preop clearance ?

## 2021-08-01 ENCOUNTER — Encounter: Payer: BC Managed Care – PPO | Admitting: Medical

## 2021-08-08 ENCOUNTER — Ambulatory Visit: Payer: BC Managed Care – PPO | Admitting: Medical

## 2021-08-08 ENCOUNTER — Encounter: Payer: Self-pay | Admitting: Medical

## 2021-08-08 VITALS — BP 104/60 | HR 91 | Ht 61.5 in | Wt 153.0 lb

## 2021-08-08 DIAGNOSIS — Z136 Encounter for screening for cardiovascular disorders: Secondary | ICD-10-CM | POA: Diagnosis not present

## 2021-08-08 DIAGNOSIS — M791 Myalgia, unspecified site: Secondary | ICD-10-CM | POA: Diagnosis not present

## 2021-08-08 DIAGNOSIS — M2550 Pain in unspecified joint: Secondary | ICD-10-CM

## 2021-08-08 DIAGNOSIS — Z7185 Encounter for immunization safety counseling: Secondary | ICD-10-CM | POA: Diagnosis not present

## 2021-08-08 DIAGNOSIS — M2560 Stiffness of unspecified joint, not elsewhere classified: Secondary | ICD-10-CM | POA: Diagnosis not present

## 2021-08-08 DIAGNOSIS — Z130 Encounter for screening for diseases of the blood and blood-forming organs and certain disorders involving the immune mechanism: Secondary | ICD-10-CM | POA: Diagnosis not present

## 2021-08-08 DIAGNOSIS — Z0000 Encounter for general adult medical examination without abnormal findings: Secondary | ICD-10-CM

## 2021-08-08 NOTE — Progress Notes (Signed)
Subjective:  ? ?HPI ? [name] is a 56 y.o. female who presents for ?Chief Complaint  ?Patient presents with  ? fasting cpe  ?  Fasting cpe, discuss hip replacement that shes about to have- obgyn- [facility]  ? ? ?Patient Care Team: ?Trevon Strothers, Cleda Mccreedy as PCP - General (Family Medicine) ?Sees dentist ?Sees eye doctor ?Dr. Charna Elizabeth, GI ?Dr. Sheran Luz, ortho ?Dr. Samson Frederic, ortho ?Dr. Tomie China, podiatry ?Dr. Derl Barrow, gynecology ? ? ?Concerns: ?Here for physical.  She is having surgery soon for hip replacement. ? ?She wants testing for rheumatoid screening.  She hurts all over.  Her hands ache, has hip pain, has history of arthritis in both knees, has shoulder bursitis, just hurts everywhere. ?She gets morning stiffness.  No specific joint swelling ? ? ?Reviewed their medical, surgical, family, social, medication, and allergy history and updated chart as appropriate. ? ?Past Medical History:  ?Diagnosis Date  ? Allergy   ? Atypical chest pain 02/11/2017  ? Bursitis of shoulder, right   ? Chronic back pain   ? since 2018  ? GERD (gastroesophageal reflux disease)   ? Heart murmur   ? as a child  ? Knee pain, bilateral   ? intermittent pain, ran track in high school; OA  ? Obesity   ? Routine gynecological examination   ? Dr. Henderson Cloud  ? Wisdom teeth extracted   ? ? ?Family History  ?Problem Relation Age of Onset  ? Hypertension Mother   ? Kidney disease Mother   ?     hypertensive kidney failure, renal transplant  ? Diabetes Mother   ? Heart disease Mother 12  ?     CHF  ? Heart failure Mother   ? Stroke Mother   ? Arthritis Father   ? Hypertension Father   ? Sleep apnea Father   ? Cancer Father   ?     leukemia  ? Fibroids Sister   ? Hypertension Sister   ? ?Past Surgical History:  ?Procedure Laterality Date  ? epidural steroid injection, lumbar spine  2019  ? FACET JOINT INJECTION  2019  ? KNEE ARTHROSCOPY WITH DRILLING/MICROFRACTURE Left 10/06/2013  ? Procedure: KNEE  ARTHROSCOPY WITH DRILLING/MICROFRACTURE AND ALLOGRAFT CARTLAGE IMPLANTATION, CHONDRAPLASTY OF TROCHIAL AND PATELLA;  Surgeon: Cammy Copa, MD;  Location: [facility] OR;  Service: Orthopedics;  Laterality: Left;  ? KNEE SURGERY  1989  ? tendonitis, right knee;   ? TUBAL LIGATION    ? ? ? ?Current Outpatient Medications:  ?  Multiple Vitamins-Minerals (HM MULTIVITAMIN ADULT GUMMY PO), Take 1 tablet by mouth daily., Disp: , Rfl:  ? ?No Known Allergies ? ? ?Review of Systems  ?Constitutional:  Negative for chills, fever, malaise/fatigue and weight loss.  ?HENT:  Negative for congestion, ear pain, hearing loss, sore throat and tinnitus.   ?Eyes:  Negative for blurred vision, pain and redness.  ?Respiratory:  Negative for cough, hemoptysis and shortness of breath.   ?Cardiovascular:  Negative for chest pain, palpitations, orthopnea, claudication and leg swelling.  ?Gastrointestinal:  Negative for abdominal pain, blood in stool, constipation, diarrhea, nausea and vomiting.  ?Genitourinary:  Negative for dysuria, flank pain, frequency, hematuria and urgency.  ?Musculoskeletal:  Positive for back pain, joint pain and myalgias. Negative for falls.  ?Skin:  Negative for itching and rash.  ?Neurological:  Negative for dizziness, tingling, speech change, weakness and headaches.  ?Endo/Heme/Allergies:  Negative for polydipsia. Does not bruise/bleed easily.  ?Psychiatric/Behavioral:  Negative for  depression and memory loss. The patient is not nervous/anxious and does not have insomnia.   ? ? ? ? ?  08/08/2021  ? 12:07 PM 02/06/2021  ?  8:42 AM 05/23/2020  ?  2:59 PM 01/05/2019  ?  3:17 PM 01/01/2018  ?  2:56 PM  ?Depression screen PHQ 2/9  ?Decreased Interest 3 0 0 0 0  ?Down, Depressed, Hopeless 3 0 0 0 0  ?PHQ - 2 Score 6 0 0 0 0  ?Altered sleeping 0      ?Tired, decreased energy 3      ?Change in appetite 3      ?Feeling bad or failure about yourself  0      ?Trouble concentrating 3      ?Moving slowly or fidgety/restless 3       ?Suicidal thoughts 0      ?PHQ-9 Score 18      ?Difficult doing work/chores Not difficult at all      ? ? ?   ?Objective:  ?BP 104/60   Pulse 91   Ht 5' 1.5" (1.562 m)   Wt 153 lb (69.4 kg)   BMI 28.44 kg/m?  ? ?General appearance: alert, no distress, WD/WN, African American female ?Skin: Unremarkable ?HEENT: normocephalic, conjunctiva/corneas normal, sclerae anicteric, PERRLA, EOMi, nares patent, no discharge or erythema, pharynx normal ?Oral cavity: MMM, tongue normal, teeth in good repair ?Neck: supple, no lymphadenopathy, no thyromegaly, no masses, normal ROM, no bruits ?Chest: non tender, normal shape and expansion ?Heart: RRR, normal S1, S2, no murmurs ?Lungs: CTA bilaterally, no wheezes, rhonchi, or rales ?Abdomen: +bs, soft, non tender, non distended, no masses, no hepatomegaly, no splenomegaly, no bruits ?Back: non tender, normal ROM, no scoliosis ?Musculoskeletal: No specific joint swelling seen, nontender in the typical fibromyalgia tender spots, otherwise limited exam without obvious deformity  ?Extremities: no edema, no cyanosis, no clubbing ?Pulses: 2+ symmetric, upper and lower extremities, normal cap refill ?Neurological: alert, oriented x 3, CN2-12 intact, strength normal upper extremities and lower extremities, sensation normal throughout, DTRs 2+ throughout, no cerebellar signs, gait normal ?Psychiatric: normal affect, behavior normal, pleasant  ?Breast/gyn/rectal - deferred to gynecology ? ?EKG indication-preop, rate 67 bpm, PR 162 ms, 80 ms QRS, 395 QTc, axis -18 degrees, normal sinus rhythm, inverted T waves in 3 and V3.  No change from 2018 EKG ? ?Of note can Myoview stress test in 2018 that was negative for ischemia and event monitor was unremarkable.  Her EKG findings are unchanged from 2019.  She does not have any cardiac symptoms. ? ? ? ?Assessment and Plan :  ? ?Encounter Diagnoses  ?Name Primary?  ? Encounter for health maintenance examination in adult Yes  ? Vaccine counseling   ?  Joint stiffness   ? Screening for blood disease   ? Screening for heart disease   ? Polyarthralgia   ? Myalgia   ? ? ? ?This visit was a preventative care visit, also known as wellness visit or routine physical.   Topics typically include healthy lifestyle, diet, exercise, preventative care, vaccinations, sick and well care, proper use of emergency dept and after hours care, as well as other concerns.   ? ? ?Recommendations: ?Continue to return yearly for your annual wellness and preventative care visits.  This gives [place] a chance to discuss healthy lifestyle, exercise, vaccinations, review your chart record, and perform screenings where appropriate. ? ?I recommend you see your eye doctor yearly for routine vision care. ? ?I recommend you see  your dentist yearly for routine dental care including hygiene visits twice yearly. ? ?See your gynecologist yearly for routine gynecological care. ? ? ?Vaccination recommendations were reviewed ?Immunization History  ?Administered Date(s) Administered  ? Hepatitis B, adult 02/29/2016, 03/30/2016, 08/29/2016  ? Influenza,inj,Quad PF,6+ Mos 11/18/2014, 12/30/2015, 12/31/2016, 01/01/2018, 12/25/2018, 01/06/2020, 02/23/2021  ? Moderna SARS-COV2 Booster Vaccination 02/18/2020  ? Moderna Sars-Covid-2 Vaccination 06/04/2019, 07/07/2019  ? PPD Test 01/02/2016  ? Tdap 05/23/2020  ? ? ?Shingles vaccine:  I recommend you have a shingles vaccine to help prevent shingles or herpes zoster outbreak.   Please call your insurer to inquire about coverage for the Shingrix vaccine given in 2 doses.   Some insurers cover this vaccine after age 55, some cover this after age 55.  If your insurer covers this, then call to schedule appointment to have this vaccine here. ? ? ? ?Screening for cancer: ?Colon cancer screening: ?I reviewed your colonoscopy on file that is up to date from 2020, repeat 7- 10 years ? ?Breast cancer screening: ?You should perform a self breast exam monthly.   ?We reviewed  recommendations for regular mammograms and breast cancer screening. ? ?Cervical cancer screening: ?We reviewed recommendations for pap smear screening. ?  ?Skin cancer screening: ?Check your skin regularly for new changes,

## 2021-08-08 NOTE — Assessment & Plan Note (Signed)
Screening labs today at her request.  Follow-up orthopedics ?

## 2021-08-09 LAB — LIPID PANEL
Chol/HDL Ratio: 2.6 ratio (ref 0.0–4.4)
Cholesterol, Total: 231 mg/dL — ABNORMAL HIGH (ref 100–199)
HDL: 89 mg/dL (ref >39)
LDL Chol Calc (NIH): 133 mg/dL — ABNORMAL HIGH (ref 0–99)
Triglycerides: 56 mg/dL (ref 0–149)
VLDL Cholesterol Cal: 9 mg/dL (ref 5–40)

## 2021-08-09 LAB — CBC
Hematocrit: 38.5 % (ref 34.0–46.6)
Hemoglobin: 12.6 g/dL (ref 11.1–15.9)
MCH: 28.7 pg (ref 26.6–33.0)
MCHC: 32.7 g/dL (ref 31.5–35.7)
MCV: 88 fL (ref 79–97)
Platelets: 302 10*3/uL (ref 150–450)
RBC: 4.39 x10E6/uL (ref 3.77–5.28)
RDW: 12.1 % (ref 11.7–15.4)
WBC: 5.6 10*3/uL (ref 3.4–10.8)

## 2021-08-09 LAB — PT AND PTT
INR: 1.1 (ref 0.9–1.2)
Prothrombin Time: 11.1 s (ref 9.1–12.0)
aPTT: 26 s (ref 24–33)

## 2021-08-09 LAB — URINALYSIS
Bilirubin, UA: NEGATIVE
Glucose, UA: NEGATIVE
Ketones, UA: NEGATIVE
Nitrite, UA: NEGATIVE
Protein,UA: NEGATIVE
RBC, UA: NEGATIVE
Specific Gravity, UA: 1.024 (ref 1.005–1.030)
Urobilinogen, Ur: 0.2 mg/dL (ref 0.2–1.0)
pH, UA: 5 (ref 5.0–7.5)

## 2021-08-09 LAB — COMPREHENSIVE METABOLIC PANEL
ALT: 17 IU/L (ref 0–32)
AST: 17 IU/L (ref 0–40)
Albumin/Globulin Ratio: 1.6 (ref 1.2–2.2)
Albumin: 4.6 g/dL (ref 3.8–4.9)
Alkaline Phosphatase: 65 IU/L (ref 44–121)
BUN/Creatinine Ratio: 13 (ref 9–23)
BUN: 12 mg/dL (ref 6–24)
Bilirubin Total: 0.2 mg/dL (ref 0.0–1.2)
CO2: 24 mmol/L (ref 20–29)
Calcium: 10.1 mg/dL (ref 8.7–10.2)
Chloride: 102 mmol/L (ref 96–106)
Creatinine, Ser: 0.92 mg/dL (ref 0.57–1.00)
Globulin, Total: 2.9 g/dL (ref 1.5–4.5)
Glucose: 81 mg/dL (ref 70–99)
Potassium: 4.4 mmol/L (ref 3.5–5.2)
Sodium: 140 mmol/L (ref 134–144)
Total Protein: 7.5 g/dL (ref 6.0–8.5)
eGFR: 74 mL/min/{1.73_m2} (ref >59)

## 2021-08-09 LAB — SEDIMENTATION RATE: Sed Rate: 43 mm/hr — ABNORMAL HIGH (ref 0–40)

## 2021-08-09 LAB — CK: Total CK: 177 U/L (ref 32–182)

## 2021-08-09 LAB — CYCLIC CITRUL PEPTIDE ANTIBODY, IGG/IGA: Cyclic Citrullin Peptide Ab: 4 units (ref 0–19)

## 2021-08-11 ENCOUNTER — Encounter: Payer: Self-pay | Admitting: Medical

## 2021-08-19 DIAGNOSIS — M533 Sacrococcygeal disorders, not elsewhere classified: Secondary | ICD-10-CM | POA: Diagnosis not present

## 2021-08-31 DIAGNOSIS — Z01419 Encounter for gynecological examination (general) (routine) without abnormal findings: Secondary | ICD-10-CM | POA: Diagnosis not present

## 2021-08-31 DIAGNOSIS — Z1231 Encounter for screening mammogram for malignant neoplasm of breast: Secondary | ICD-10-CM | POA: Diagnosis not present

## 2021-09-22 DIAGNOSIS — M1612 Unilateral primary osteoarthritis, left hip: Secondary | ICD-10-CM | POA: Diagnosis not present

## 2021-11-22 DIAGNOSIS — M533 Sacrococcygeal disorders, not elsewhere classified: Secondary | ICD-10-CM | POA: Diagnosis not present

## 2021-11-25 ENCOUNTER — Emergency Department (HOSPITAL_COMMUNITY)
Admission: EM | Admit: 2021-11-25 | Discharge: 2021-11-25 | Disposition: A | Discharge status: Home / Self Care | Payer: BC Managed Care – PPO | Attending: Emergency Medicine | Admitting: Emergency Medicine

## 2021-11-25 ENCOUNTER — Encounter (HOSPITAL_COMMUNITY): Payer: Self-pay

## 2021-11-25 ENCOUNTER — Emergency Department (HOSPITAL_COMMUNITY): Payer: BC Managed Care – PPO

## 2021-11-25 ENCOUNTER — Other Ambulatory Visit: Payer: Self-pay

## 2021-11-25 DIAGNOSIS — M66261 Spontaneous rupture of extensor tendons, right lower leg: Secondary | ICD-10-CM | POA: Diagnosis not present

## 2021-11-25 DIAGNOSIS — S86811A Strain of other muscle(s) and tendon(s) at lower leg level, right leg, initial encounter: Secondary | ICD-10-CM | POA: Diagnosis not present

## 2021-11-25 DIAGNOSIS — M25561 Pain in right knee: Secondary | ICD-10-CM | POA: Diagnosis not present

## 2021-11-25 DIAGNOSIS — M6688 Spontaneous rupture of other tendons, other: Secondary | ICD-10-CM | POA: Diagnosis not present

## 2021-11-25 MED ORDER — HYDROMORPHONE HCL 2 MG/ML IJ SOLN
1.0000 mg | Freq: Once | INTRAMUSCULAR | Status: AC
Start: 2021-11-25 — End: 2021-11-25
  Administered 2021-11-25: 1 mg via INTRAMUSCULAR
  Filled 2021-11-25: qty 1

## 2021-11-25 MED ORDER — MORPHINE SULFATE (PF) 4 MG/ML IV SOLN
4.0000 mg | Freq: Once | INTRAVENOUS | Status: AC
  Administered 2021-11-25: 4 mg via INTRAMUSCULAR
  Filled 2021-11-25: qty 1

## 2021-11-25 MED ORDER — OXYCODONE-ACETAMINOPHEN 5-325 MG PO TABS
1.0000 | ORAL_TABLET | Freq: Four times a day (QID) | ORAL | 0 refills | Status: DC | PRN
Start: 2021-11-25 — End: 2022-06-18

## 2021-11-25 MED ORDER — METHOCARBAMOL 750 MG PO TABS: 750.0000 mg | ORAL_TABLET | Freq: Four times a day (QID) | ORAL | 0 refills | Status: DC

## 2021-11-25 MED ORDER — KETOROLAC TROMETHAMINE 30 MG/ML IJ SOLN
30.0000 mg | Freq: Once | INTRAMUSCULAR | Status: AC
  Administered 2021-11-25: 30 mg via INTRAMUSCULAR
  Filled 2021-11-25: qty 1

## 2021-11-25 NOTE — ED Provider Notes (Signed)
[facility]-EMERGENCY DEPT Provider Note   CSN: [id number] Arrival date & time: 11/25/21  1925     History  Chief Complaint  Patient presents with   Knee Pain    Leyanna T Turner-Cuthrell is a 56 y.o. female.  56 year old female complains of right knee pain that occurred when she hyperextended it.  Now complains of sharp pain to her patella that is worse with any type of ambulation.  Denies any hip or ankle discomfort.  Pain better with remaining still.  No treatment use prior to arrival       Home Medications Prior to Admission medications   Medication Sig Start Date End Date Taking? Authorizing Provider  Multiple Vitamins-Minerals (HM MULTIVITAMIN ADULT GUMMY PO) Take 1 tablet by mouth daily.    [provider]      Allergies    Patient has no known allergies.    Review of Systems   Review of Systems  All other systems reviewed and are negative.   Physical Exam Updated Vital Signs BP 121/70   Pulse 86   Temp 98 F (36.7 C)   Resp 18   Ht 1.549 m (5\' 1" )   Wt 70 kg   SpO2 99%   BMI 29.16 kg/m  Physical Exam Vitals and nursing note reviewed.  Constitutional:      General: She is not in acute distress.    Appearance: Normal appearance. She is well-developed. She is not toxic-appearing.  HENT:     Head: Normocephalic and atraumatic.  Eyes:     General: Lids are normal.     Conjunctiva/sclera: Conjunctivae normal.     Pupils: Pupils are equal, round, and reactive to light.  Neck:     Thyroid: No thyroid mass.     Trachea: No tracheal deviation.  Cardiovascular:     Rate and Rhythm: Normal rate and regular rhythm.     Heart sounds: Normal heart sounds. No murmur heard.    No gallop.  Pulmonary:     Effort: Pulmonary effort is normal. No respiratory distress.     Breath sounds: Normal breath sounds. No stridor. No decreased breath sounds, wheezing, rhonchi or rales.  Abdominal:     General: There is no distension.      Palpations: Abdomen is soft.     Tenderness: There is no abdominal tenderness. There is no rebound.  Musculoskeletal:        General: No tenderness.     Cervical back: Normal range of motion and neck supple.     Right knee: Decreased range of motion. Abnormal patellar mobility.  Skin:    General: Skin is warm and dry.     Findings: No abrasion or rash.  Neurological:     Mental Status: She is alert and oriented to person, place, and time. Mental status is at baseline.     GCS: GCS eye subscore is 4. GCS verbal subscore is 5. GCS motor subscore is 6.     Cranial Nerves: No cranial nerve deficit.     Sensory: No sensory deficit.     Motor: Motor function is intact.  Psychiatric:        Attention and Perception: Attention normal.        Speech: Speech normal.        Behavior: Behavior normal.     ED Results / Procedures / Treatments   Labs (all labs ordered are listed, but only abnormal results are displayed) Labs Reviewed - No data to  display  EKG None  Radiology No results found.  Procedures Procedures    Medications Ordered in ED Medications  morphine (PF) 4 MG/ML injection 4 mg (4 mg Intramuscular Given 11/25/21 1950)    ED Course/ Medical Decision Making/ A&P Clinical Course as of 11/25/21 2107  Sat Nov 25, 2021  2016 Right knee XR critical result called back to me revealing patella being locked due to osteocytes. Recommend possible manipulation of patella. No evidence of fracture [GL]    Clinical Course User Index [GL] Claudie Leach, PA-C                           Medical Decision Making Risk Prescription drug management.   Patient medicated for pain here with morphine.  Patient's knee x-ray consistent with likely patella tendon rupture inferior.  Discussed with Dr. Thomasena Edis on-call for orthopedic surgery.  Recommends patient be placed in knee immobilizer and given crutches and follow-up with her orthopedist.  Patient be medicated for pain.  Plan of care  discussed with her and her husband and they are agreeable        Final Clinical Impression(s) / ED Diagnoses Final diagnoses:  None    Rx / DC Orders ED Discharge Orders     None         Lorre Nick, MD 11/25/21 2108

## 2021-11-25 NOTE — Discharge Instructions (Addendum)
Call Dr. Linna Caprice on Monday to schedule a follow-up visit

## 2021-11-25 NOTE — ED Triage Notes (Signed)
Pt sts was bent over w/ knees extended unloading groceries from car at home. R knee locked up and is now unable to bend. C/o severe r knee pain. Knee appears somewhat hyperextended. Unable to ambulate.

## 2021-11-29 DIAGNOSIS — M25561 Pain in right knee: Secondary | ICD-10-CM | POA: Diagnosis not present

## 2021-11-30 DIAGNOSIS — M25361 Other instability, right knee: Secondary | ICD-10-CM | POA: Diagnosis not present

## 2021-11-30 DIAGNOSIS — M25561 Pain in right knee: Secondary | ICD-10-CM | POA: Diagnosis not present

## 2021-11-30 DIAGNOSIS — S76191S Other specified injury of right quadriceps muscle, fascia and tendon, sequela: Secondary | ICD-10-CM | POA: Diagnosis not present

## 2021-12-12 ENCOUNTER — Other Ambulatory Visit (INDEPENDENT_AMBULATORY_CARE_PROVIDER_SITE_OTHER): Payer: BC Managed Care – PPO

## 2021-12-12 DIAGNOSIS — Z23 Encounter for immunization: Secondary | ICD-10-CM

## 2022-01-16 ENCOUNTER — Encounter: Payer: Self-pay | Admitting: Internal Medicine

## 2022-04-26 DIAGNOSIS — M533 Sacrococcygeal disorders, not elsewhere classified: Secondary | ICD-10-CM | POA: Diagnosis not present

## 2022-04-30 DIAGNOSIS — S76012D Strain of muscle, fascia and tendon of left hip, subsequent encounter: Secondary | ICD-10-CM | POA: Diagnosis not present

## 2022-04-30 DIAGNOSIS — Z96642 Presence of left artificial hip joint: Secondary | ICD-10-CM | POA: Diagnosis not present

## 2022-05-03 DIAGNOSIS — M533 Sacrococcygeal disorders, not elsewhere classified: Secondary | ICD-10-CM | POA: Diagnosis not present

## 2022-05-27 ENCOUNTER — Ambulatory Visit: Payer: Self-pay

## 2022-05-27 ENCOUNTER — Ambulatory Visit
Admission: RE | Admit: 2022-05-27 | Discharge: 2022-05-27 | Disposition: A | Discharge status: Home / Self Care | Payer: BC Managed Care – PPO | Source: Ambulatory Visit | Attending: Urgent Care | Admitting: Urgent Care

## 2022-05-27 VITALS — BP 130/74 | HR 86 | Temp 99.3°F | Resp 18 | Ht 61.5 in | Wt 150.0 lb

## 2022-05-27 DIAGNOSIS — S46911A Strain of unspecified muscle, fascia and tendon at shoulder and upper arm level, right arm, initial encounter: Secondary | ICD-10-CM | POA: Diagnosis not present

## 2022-05-27 DIAGNOSIS — S60221A Contusion of right hand, initial encounter: Secondary | ICD-10-CM

## 2022-05-27 DIAGNOSIS — M79641 Pain in right hand: Secondary | ICD-10-CM

## 2022-05-27 DIAGNOSIS — M25511 Pain in right shoulder: Secondary | ICD-10-CM

## 2022-05-27 DIAGNOSIS — S6000XA Contusion of unspecified finger without damage to nail, initial encounter: Secondary | ICD-10-CM

## 2022-05-27 MED ORDER — TIZANIDINE HCL 4 MG PO TABS: 4.0000 mg | ORAL_TABLET | Freq: Every day | ORAL | 0 refills | Status: DC

## 2022-05-27 MED ORDER — NAPROXEN 500 MG PO TABS
500.0000 mg | ORAL_TABLET | Freq: Two times a day (BID) | ORAL | 0 refills | Status: DC
Start: 2022-05-27 — End: 2022-06-18

## 2022-05-27 MED ORDER — KETOROLAC TROMETHAMINE 60 MG/2ML IM SOLN
60.0000 mg | Freq: Once | INTRAMUSCULAR | Status: AC
  Administered 2022-05-27: 30 mg via INTRAMUSCULAR

## 2022-05-27 NOTE — ED Provider Notes (Signed)
[facility]  Note:  This document was prepared using Systems analyst and may include unintentional dictation errors.  MRN: [id number] DOB: 06/15/1965  Subjective:   [name] is a 57 y.o. female presenting for 2-day history of persistent right shoulder pain, right hand pain.  Symptoms started the day after patient was arrested.  States that the police officers forcibly applied handcuffs to her wrist directly over the area that hurts her on the right hand and felt that they also aggressively put her hand behind her back causing the shoulder pain.  She has decreased range of motion but is able to move her has not used any pain medications.  No bruising, wounds or any deformity that she is aware of.  No current facility-administered medications for this encounter.  Current Outpatient Medications:    methocarbamol (ROBAXIN-750) 750 MG tablet, Take 1 tablet (750 mg total) by mouth 4 (four) times daily., Disp: 30 tablet, Rfl: 0   Multiple Vitamins-Minerals (HM MULTIVITAMIN ADULT GUMMY PO), Take 1 tablet by mouth daily., Disp: , Rfl:    oxyCODONE-acetaminophen (PERCOCET/ROXICET) 5-325 MG tablet, Take 1 tablet by mouth every 6 (six) hours as needed for severe pain., Disp: 15 tablet, Rfl: 0   No Known Allergies  Past Medical History:  Diagnosis Date   Allergy    Atypical chest pain 02/11/2017   Bursitis of shoulder, right    Chronic back pain    since 2018   GERD (gastroesophageal reflux disease)    Heart murmur    as a child   Knee pain, bilateral    intermittent pain, ran track in high school; OA   Obesity    Routine gynecological examination    Dr. Philis Pique   Wisdom teeth extracted      Past Surgical History:  Procedure Laterality Date   epidural steroid injection, lumbar spine  2019   FACET JOINT INJECTION  2019   KNEE ARTHROSCOPY WITH DRILLING/MICROFRACTURE Left 10/06/2013   Procedure: KNEE ARTHROSCOPY WITH  DRILLING/MICROFRACTURE AND ALLOGRAFT CARTLAGE IMPLANTATION, CHONDRAPLASTY OF TROCHIAL AND PATELLA;  Surgeon: Meredith Pel, MD;  Location: [facility];  Service: Orthopedics;  Laterality: Left;   KNEE SURGERY  1989   tendonitis, right knee;    TUBAL LIGATION      Family History  Problem Relation Age of Onset   Hypertension Mother    Kidney disease Mother        hypertensive kidney failure, renal transplant   Diabetes Mother    Heart disease Mother 60       CHF   Heart failure Mother    Stroke Mother    Arthritis Father    Hypertension Father    Sleep apnea Father    Cancer Father        leukemia   Fibroids Sister    Hypertension Sister     Social History   Tobacco Use   Smoking status: Never   Smokeless tobacco: Never  Vaping Use   Vaping Use: Never used  Substance Use Topics   Alcohol use: No   Drug use: Never    ROS   Objective:   Vitals: BP 130/74 (BP Location: Left Arm)   Pulse 86   Temp 99.3 F (37.4 C) (Oral)   Resp 18   Ht 5' 1.5" (1.562 m)   Wt 150 lb (68 kg)   SpO2 98%   BMI 27.88 kg/m   Physical Exam Constitutional:      General:  She is not in acute distress.    Appearance: Normal appearance. She is well-developed. She is not ill-appearing, toxic-appearing or diaphoretic.  HENT:     Head: Normocephalic and atraumatic.     Nose: Nose normal.     Mouth/Throat:     Mouth: Mucous membranes are moist.  Eyes:     General: No scleral icterus.       Right eye: No discharge.        Left eye: No discharge.     Extraocular Movements: Extraocular movements intact.  Cardiovascular:     Rate and Rhythm: Normal rate.  Pulmonary:     Effort: Pulmonary effort is normal.  Musculoskeletal:     Right shoulder: Tenderness (throughout) and bony tenderness present. No swelling, deformity, effusion, laceration or crepitus. Decreased range of motion (limited beyond 90 degrees abduction). Normal strength.     Right upper arm: No swelling, edema, deformity,  lacerations, tenderness or bony tenderness.     Right wrist: No swelling, deformity, effusion, lacerations, tenderness, bony tenderness, snuff box tenderness or crepitus. Normal range of motion.     Right hand: Tenderness (over area outlined with slight ecchymosis) present. No swelling, deformity, lacerations or bony tenderness. Decreased range of motion. Normal strength. Normal sensation. Normal capillary refill.       Hands:  Skin:    General: Skin is warm and dry.  Neurological:     General: No focal deficit present.     Mental Status: She is alert and oriented to person, place, and time.  Psychiatric:        Mood and Affect: Mood normal.        Behavior: Behavior normal.    IM Toradol 60 mg in clinic.  Assessment and Plan :   PDMP not reviewed this encounter.  1. Contusion of right hand, initial encounter   2. Right shoulder strain, initial encounter   3. Right hand pain   4. Acute pain of right shoulder     Recommended conservative management for right hand contusion, right shoulder strain.  Offered imaging but patient declined for now and I am in agreement.  Toradol as above.  Use RICE method, naproxen for pain and inflammation. Counseled patient on potential for adverse effects with medications prescribed/recommended today, ER and return-to-clinic precautions discussed, patient verbalized understanding.    Jaynee Eagles, PA-C 05/27/22 1335    Jaynee Eagles, PA-C 05/30/22 1414

## 2022-05-27 NOTE — ED Triage Notes (Signed)
X2 days  Pt states that she injured her right hand. Pt states that she has pain running from her arm up to her shoulder.

## 2022-06-11 DIAGNOSIS — R0789 Other chest pain: Secondary | ICD-10-CM | POA: Diagnosis not present

## 2022-06-11 DIAGNOSIS — M79602 Pain in left arm: Secondary | ICD-10-CM | POA: Diagnosis not present

## 2022-06-11 DIAGNOSIS — M5412 Radiculopathy, cervical region: Secondary | ICD-10-CM | POA: Diagnosis not present

## 2022-06-11 DIAGNOSIS — M542 Cervicalgia: Secondary | ICD-10-CM | POA: Diagnosis not present

## 2022-06-11 DIAGNOSIS — M25512 Pain in left shoulder: Secondary | ICD-10-CM | POA: Diagnosis not present

## 2022-06-11 DIAGNOSIS — R079 Chest pain, unspecified: Secondary | ICD-10-CM | POA: Diagnosis not present

## 2022-06-18 ENCOUNTER — Ambulatory Visit: Payer: BC Managed Care – PPO | Admitting: Medical

## 2022-06-18 VITALS — BP 110/60 | HR 83 | Wt 152.4 lb

## 2022-06-18 DIAGNOSIS — M79602 Pain in left arm: Secondary | ICD-10-CM

## 2022-06-18 DIAGNOSIS — M549 Dorsalgia, unspecified: Secondary | ICD-10-CM | POA: Diagnosis not present

## 2022-06-18 DIAGNOSIS — M62830 Muscle spasm of back: Secondary | ICD-10-CM | POA: Diagnosis not present

## 2022-06-18 NOTE — Patient Instructions (Signed)
Your current findings suggest upper back and left arm strain and spasm.  Recommendations: I recommend up to the counter ibuprofen 200 mg, 3 tablets 3 times a day for the next 5 days, then use as needed You have a muscle laxer prescribed you recently.  Use this once or twice daily for the next few days to help with tension and spasm Do some stretching range of motion exercises of the neck and arm for the next few days You can over-the-counter arm sling to rest the arm for periods of time for the next few days Consider massage therapy or chiropractic therapy in the next few days  Your recent chest pain suggest more of a musculoskeletal origin  As far as shortness of breath, your recent chest x-ray and EKG and heart studies were normal  Sometimes shortness of breath is related to allergies, can be related to anxiety or acid reflux even.  Recent labs did not show any worrisome causes of shortness of breath.

## 2022-06-18 NOTE — Progress Notes (Signed)
Subjective:  [name] is a 57 y.o. female who presents for Chief Complaint  Patient presents with   Shortness of Breath    Shortness of breath. Left arm pain. Went to ER last weekend for SOB and left arm pain and was told it wasn't cardiac but still having issues. Left arm feels heavy, SOB when walking around and feels tired all the time.      Here for concerns.     Here for complaints of SOB and left arm pain x a week.   Went to emergency dept for same 06/11/22.  In general feeling winded and tired.   Moving or doing some active seems to make her more winded, out of breath.   No leg pain, calve pain or calve swelling.  Sometimes feels like heart racing.   Nonsmoker.   No alcohol.  No drugs.   Walks regularly for exercise up until these symptoms.   She does report that before this pain in her arm and neck she had arrived to her house to find the police there.  She came up to find that was what was going on and the police restrain her putting her arms behind her back in handcuffs temporarily.  She thinks that may have aggravated her neck and arm.  No other recent fall or trauma or injury  No other aggravating or relieving factors.    No other c/o.  Past Medical History:  Diagnosis Date   Allergy    Atypical chest pain 02/11/2017   Bursitis of shoulder, right    Chronic back pain    since 2018   GERD (gastroesophageal reflux disease)    Heart murmur    as a child   Knee pain, bilateral    intermittent pain, ran track in high school; OA   Obesity    Routine gynecological examination    Dr. Philis Pique   Wisdom teeth extracted    Current Outpatient Medications on File Prior to Visit  Medication Sig Dispense Refill   Multiple Vitamins-Minerals (HM MULTIVITAMIN ADULT GUMMY PO) Take 1 tablet by mouth daily.     tiZANidine (ZANAFLEX) 4 MG tablet Take 1 tablet (4 mg total) by mouth at bedtime. 30 tablet 0   methocarbamol (ROBAXIN-750) 750 MG tablet Take 1 tablet (750 mg  total) by mouth 4 (four) times daily. (Patient not taking: Reported on 06/18/2022) 30 tablet 0   No current facility-administered medications on file prior to visit.    The following portions of the patient's history were reviewed and updated as appropriate: allergies, current medications, past family history, past medical history, past social history, past surgical history and problem list.  ROS Otherwise as in subjective above  Objective: BP 110/60   Pulse 83   Wt 152 lb 6.4 oz (69.1 kg)   SpO2 98%   BMI 28.33 kg/m   Wt Readings from Last 3 Encounters:  06/18/22 152 lb 6.4 oz (69.1 kg)  05/27/22 150 lb (68 kg)  11/25/21 154 lb 5.2 oz (70 kg)    General appearance: alert, no distress, well developed, well nourished Neck: supple, no lymphadenopathy, no thyromegaly, no masses, supple normal range of motion Back: Upper left back with positive spasm and tenderness otherwise unremarkable Heart: RRR, normal S1, S2, no murmurs Lungs: CTA bilaterally, no wheezes, rhonchi, or rales  MSK: Tender over the left triceps, tender over left AC joint, range of motion limited to flexion of shoulder about 90 degrees, decreased internal and external range of  motion, otherwise passive range of motion about 80% of normal, mild pain with apprehension test, negative crossover test, rest of arm and right arm exam normal Arms neurovascularly intact Pulses: 2+ radial pulses, 2+ pedal pulses, normal cap refill Ext: no edema    Assessment: Encounter Diagnoses  Name Primary?   Upper back pain Yes   Back spasm    Left arm pain      Plan: I reviewed your recent emergency department notes, labs, x-rays including chest and neck x-ray, recent EKG and labs from 06/11/2022   Your current findings suggest upper back and left arm strain and spasm.  Recommendations: I recommend up to the counter ibuprofen 200 mg, 3 tablets 3 times a day for the next 5 days, then use as needed You have a muscle laxer  prescribed you recently.  Use this once or twice daily for the next few days to help with tension and spasm Do some stretching range of motion exercises of the neck and arm for the next few days You can over-the-counter arm sling to rest the arm for periods of time for the next few days Consider massage therapy or chiropractic therapy in the next few days  Your recent chest pain suggest more of a musculoskeletal origin  As far as shortness of breath, your recent chest x-ray and EKG and heart studies were normal  Sometimes shortness of breath is related to allergies, can be related to anxiety or acid reflux even.  Recent labs did not show any worrisome causes of shortness of breath.  Tanyia was seen today for shortness of breath.  Diagnoses and all orders for this visit:  Upper back pain  Back spasm  Left arm pain    Follow up: prn

## 2022-07-03 DIAGNOSIS — M7552 Bursitis of left shoulder: Secondary | ICD-10-CM | POA: Diagnosis not present

## 2022-07-03 DIAGNOSIS — M5412 Radiculopathy, cervical region: Secondary | ICD-10-CM | POA: Diagnosis not present

## 2022-07-03 DIAGNOSIS — Z1330 Encounter for screening examination for mental health and behavioral disorders, unspecified: Secondary | ICD-10-CM | POA: Diagnosis not present

## 2022-07-16 DIAGNOSIS — M1611 Unilateral primary osteoarthritis, right hip: Secondary | ICD-10-CM | POA: Diagnosis not present

## 2022-07-18 DIAGNOSIS — M25551 Pain in right hip: Secondary | ICD-10-CM | POA: Diagnosis not present

## 2022-08-01 DIAGNOSIS — M47812 Spondylosis without myelopathy or radiculopathy, cervical region: Secondary | ICD-10-CM | POA: Diagnosis not present

## 2022-08-13 ENCOUNTER — Ambulatory Visit: Payer: BC Managed Care – PPO | Admitting: Medical

## 2022-08-13 VITALS — BP 110/70 | HR 80 | Ht 62.0 in | Wt 148.0 lb

## 2022-08-13 DIAGNOSIS — J309 Allergic rhinitis, unspecified: Secondary | ICD-10-CM

## 2022-08-13 DIAGNOSIS — Z0000 Encounter for general adult medical examination without abnormal findings: Secondary | ICD-10-CM | POA: Diagnosis not present

## 2022-08-13 DIAGNOSIS — G8929 Other chronic pain: Secondary | ICD-10-CM

## 2022-08-13 DIAGNOSIS — M791 Myalgia, unspecified site: Secondary | ICD-10-CM

## 2022-08-13 DIAGNOSIS — Z13220 Encounter for screening for lipoid disorders: Secondary | ICD-10-CM | POA: Diagnosis not present

## 2022-08-13 DIAGNOSIS — Z7185 Encounter for immunization safety counseling: Secondary | ICD-10-CM

## 2022-08-13 DIAGNOSIS — M5441 Lumbago with sciatica, right side: Secondary | ICD-10-CM

## 2022-08-13 DIAGNOSIS — Z129 Encounter for screening for malignant neoplasm, site unspecified: Secondary | ICD-10-CM | POA: Insufficient documentation

## 2022-08-13 DIAGNOSIS — K219 Gastro-esophageal reflux disease without esophagitis: Secondary | ICD-10-CM

## 2022-08-13 DIAGNOSIS — M5442 Lumbago with sciatica, left side: Secondary | ICD-10-CM | POA: Diagnosis not present

## 2022-08-13 DIAGNOSIS — R200 Anesthesia of skin: Secondary | ICD-10-CM

## 2022-08-13 LAB — LIPID PANEL

## 2022-08-13 NOTE — Progress Notes (Signed)
Documented this already

## 2022-08-13 NOTE — Progress Notes (Signed)
Subjective:   HPI  [name] is a 57 y.o. female who presents for Chief Complaint  Patient presents with   fasting cpe    Fasting cpe, having some issues with neck and arm going numb    Patient Care Team: Shanitha Twining, Cleda Mccreedy as PCP - General (Family Medicine) Ob/Gyn, [facility] Sees dentist Sees eye doctor Dr. Charna Elizabeth, GI Dr. Sheran Luz, ortho Dr. Samson Frederic, ortho Dr. Tomie China, podiatry Dr. Derl Barrow, gynecology   Concerns: Recently having some problem with numbnss in upper arms, R>L.  Worse at night in sleep, not so much in daytime.  Seeing spinal specialist, had recent MRI of neck due to this.  Awaiting results.   No alcohol   Reviewed their medical, surgical, family, social, medication, and allergy history and updated chart as appropriate.  Past Medical History:  Diagnosis Date   Allergy    Atypical chest pain 02/11/2017   Bursitis of shoulder, right    Chronic back pain    since 2018   GERD (gastroesophageal reflux disease)    Heart murmur    as a child   Knee pain, bilateral    intermittent pain, ran track in high school; OA   Obesity    Routine gynecological examination    Dr. Henderson Cloud   Wisdom teeth extracted     Family History  Problem Relation Age of Onset   Hypertension Mother    Kidney disease Mother        hypertensive kidney failure, renal transplant   Diabetes Mother    Heart disease Mother 98       CHF   Heart failure Mother    Stroke Mother    Arthritis Father    Hypertension Father    Sleep apnea Father    Cancer Father        leukemia   Fibroids Sister    Hypertension Sister    Past Surgical History:  Procedure Laterality Date   epidural steroid injection, lumbar spine  2019   FACET JOINT INJECTION  2019   KNEE ARTHROSCOPY WITH DRILLING/MICROFRACTURE Left 10/06/2013   Procedure: KNEE ARTHROSCOPY WITH DRILLING/MICROFRACTURE AND ALLOGRAFT CARTLAGE IMPLANTATION, CHONDRAPLASTY OF TROCHIAL AND  PATELLA;  Surgeon: Cammy Copa, MD;  Location: [facility] OR;  Service: Orthopedics;  Laterality: Left;   KNEE SURGERY  1989   tendonitis, right knee;    TUBAL LIGATION       Current Outpatient Medications:    methocarbamol (ROBAXIN-750) 750 MG tablet, Take 1 tablet (750 mg total) by mouth 4 (four) times daily., Disp: 30 tablet, Rfl: 0   Multiple Vitamins-Minerals (HM MULTIVITAMIN ADULT GUMMY PO), Take 1 tablet by mouth daily., Disp: , Rfl:    tiZANidine (ZANAFLEX) 4 MG tablet, Take 1 tablet (4 mg total) by mouth at bedtime., Disp: 30 tablet, Rfl: 0  No Known Allergies  Review of Systems  Constitutional:  Negative for chills, fever, malaise/fatigue and weight loss.  HENT:  Negative for congestion, ear pain, hearing loss, sore throat and tinnitus.   Eyes:  Negative for blurred vision, pain and redness.  Respiratory:  Negative for cough, hemoptysis and shortness of breath.   Cardiovascular:  Negative for chest pain, palpitations, orthopnea, claudication and leg swelling.  Gastrointestinal:  Negative for abdominal pain, blood in stool, constipation, diarrhea, nausea and vomiting.  Genitourinary:  Negative for dysuria, flank pain, frequency, hematuria and urgency.  Musculoskeletal:  Negative for falls, joint pain and myalgias.  Skin:  Negative for itching  and rash.  Neurological:  Positive for tingling. Negative for dizziness, speech change, weakness and headaches.  Endo/Heme/Allergies:  Negative for polydipsia. Does not bruise/bleed easily.  Psychiatric/Behavioral:  Negative for depression and memory loss. The patient is not nervous/anxious and does not have insomnia.         08/13/2022    1:36 PM 08/08/2021   12:07 PM 02/06/2021    8:42 AM 05/23/2020    2:59 PM 01/05/2019    3:17 PM  Depression screen PHQ 2/9  Decreased Interest 0 3 0 0 0  Down, Depressed, Hopeless 0 3 0 0 0  PHQ - 2 Score 0 6 0 0 0  Altered sleeping  0     Tired, decreased energy  3     Change in appetite  3      Feeling bad or failure about yourself   0     Trouble concentrating  3     Moving slowly or fidgety/restless  3     Suicidal thoughts  0     PHQ-9 Score  18     Difficult doing work/chores  Not difficult at all          Objective:  BP 110/70   Pulse 80   Ht 5\' 2"  (1.575 m)   Wt 148 lb (67.1 kg)   BMI 27.07 kg/m   Wt Readings from Last 3 Encounters:  08/13/22 148 lb (67.1 kg)  06/18/22 152 lb 6.4 oz (69.1 kg)  05/27/22 150 lb (68 kg)   General appearance: alert, no distress, WD/WN, African American female Skin: Unremarkable HEENT: normocephalic, conjunctiva/corneas normal, sclerae anicteric, PERRLA, EOMi, nares patent, no discharge or erythema, pharynx normal Oral cavity: MMM, tongue normal, teeth in good repair Neck: supple, no lymphadenopathy, no thyromegaly, no masses, normal ROM, no bruits Chest: non tender, normal shape and expansion Heart: RRR, normal S1, S2, no murmurs Lungs: CTA bilaterally, no wheezes, rhonchi, or rales Abdomen: +bs, soft, non tender, non distended, no masses, no hepatomegaly, no splenomegaly, no bruits Back: non tender, normal ROM, no scoliosis Musculoskeletal: No specific joint swelling seen, nontender in the typical fibromyalgia tender spots, otherwise limited exam without obvious deformity  Extremities: no edema, no cyanosis, no clubbing Pulses: 2+ symmetric, upper and lower extremities, normal cap refill Neurological:-phalens and tinels, alert, oriented x 3, CN2-12 intact, strength normal upper extremities and lower extremities, sensation normal throughout, DTRs 2+ throughout, no cerebellar signs, gait normal Psychiatric: normal affect, behavior normal, pleasant  Breast/gyn/rectal - deferred to gynecology     Assessment and Plan :   Encounter Diagnoses  Name Primary?   Encounter for health maintenance examination in adult Yes   Allergic rhinitis, unspecified seasonality, unspecified trigger    Gastroesophageal reflux disease without  esophagitis    Chronic bilateral low back pain with bilateral sciatica    Vaccine counseling    Screening for lipid disorders    Screening for cancer    Myalgia    Arm numbness      This visit was a preventative care visit, also known as wellness visit or routine physical.   Topics typically include healthy lifestyle, diet, exercise, preventative care, vaccinations, sick and well care, proper use of emergency dept and after hours care, as well as other concerns.     Recommendations: Continue to return yearly for your annual wellness and preventative care visits.  This gives [place] a chance to discuss healthy lifestyle, exercise, vaccinations, review your chart record, and perform screenings where appropriate.  I recommend you see your eye doctor yearly for routine vision care.  I recommend you see your dentist yearly for routine dental care including hygiene visits twice yearly.  See your gynecologist yearly for routine gynecological care.   Vaccination recommendations were reviewed Immunization History  Administered Date(s) Administered   Hepatitis B, ADULT 02/29/2016, 03/30/2016, 08/29/2016   Influenza,inj,Quad PF,6+ Mos 11/18/2014, 12/30/2015, 12/31/2016, 01/01/2018, 12/25/2018, 01/06/2020, 02/23/2021, 12/12/2021   Moderna SARS-COV2 Booster Vaccination 02/18/2020   Moderna Sars-Covid-2 Vaccination 06/04/2019, 07/07/2019   PPD Test 01/02/2016   Tdap 05/23/2020    Shingles vaccine: we will request copy from pharmacy for earlier 2024   Screening for cancer: Colon cancer screening: I reviewed your colonoscopy on file that is up to date from 2020, repeat 7- 10 years  Breast cancer screening: You should perform a self breast exam monthly.   We reviewed recommendations for regular mammograms and breast cancer screening.  Cervical cancer screening: We reviewed recommendations for pap smear screening.   Skin cancer screening: Check your skin regularly for new changes, growing  lesions, or other lesions of concern Come in for evaluation if you have skin lesions of concern.  Lung cancer screening: If you have a greater than 20 pack year history of tobacco use, then you may qualify for lung cancer screening with a chest CT scan.   Please call your insurance company to inquire about coverage for this test.  We currently don't have screenings for other cancers besides breast, cervical, colon, and lung cancers.  If you have a strong family history of cancer or have other cancer screening concerns, please let me know.    Bone health: Get at least 150 minutes of aerobic exercise weekly Get weight bearing exercise at least once weekly Bone density test:  A bone density test is an imaging test that uses a type of X-ray to measure the amount of calcium and other minerals in your bones. The test may be used to diagnose or screen you for a condition that causes weak or thin bones (osteoporosis), predict your risk for a broken bone (fracture), or determine how well your osteoporosis treatment is working. The bone density test is recommended for females 65 and older, or females or males <65 if certain risk factors such as thyroid disease, long term use of steroids such as for asthma or rheumatological issues, vitamin D deficiency, estrogen deficiency, family history of osteoporosis, self or family history of fragility fracture in first degree relative.    Heart health: Get at least 150 minutes of aerobic exercise weekly Limit alcohol It is important to maintain a healthy blood pressure and healthy cholesterol numbers  Heart disease screening: Screening for heart disease includes screening for blood pressure, fasting lipids, glucose/diabetes screening, BMI height to weight ratio, reviewed of smoking status, physical activity, and diet.    Goals include blood pressure 120/80 or less, maintaining a healthy lipid/cholesterol profile, preventing diabetes or keeping diabetes numbers  under good control, not smoking or using tobacco products, exercising most days per week or at least 150 minutes per week of exercise, and eating healthy variety of fruits and vegetables, healthy oils, and avoiding unhealthy food choices like fried food, fast food, high sugar and high cholesterol foods.    Other tests may possibly include EKG test, CT coronary calcium score, echocardiogram, exercise treadmill stress test.   Consider CT coronary test/screening    Medical care options: I recommend you continue to seek care here first for routine care.  We try  really hard to have available appointments Monday through Friday daytime hours for sick visits, acute visits, and physicals.  Urgent care should be used for after hours and weekends for significant issues that cannot wait till the next day.  The emergency department should be used for significant potentially life-threatening emergencies.  The emergency department is expensive, can often have long wait times for less significant concerns, so try to utilize primary care, urgent care, or telemedicine when possible to avoid unnecessary trips to the emergency department.  Virtual visits and telemedicine have been introduced since the pandemic started in 2020, and can be convenient ways to receive medical care.  We offer virtual appointments as well to assist you in a variety of options to seek medical care.   Separate significant issues discussed: Encounter Diagnoses  Name Primary?   Encounter for health maintenance examination in adult Yes   Allergic rhinitis, unspecified seasonality, unspecified trigger    Gastroesophageal reflux disease without esophagitis    Chronic bilateral low back pain with bilateral sciatica    Vaccine counseling    Screening for lipid disorders    Screening for cancer    Myalgia    Arm numbness    I reviewed her recent MRI cervical spine in Care Everywhere.  She has been having some arm numbness and aches in her  upper extremities.  No obvious ruptured disc or areas suggesting the need for surgical intervention.  I suspect her numbness may be caused by positional issues during sleep.  We also discussed the potential for sleep apnea.  She will follow-up with Ortho as planned.  Labs as below   Towanna was seen today for fasting cpe.  Diagnoses and all orders for this visit:  Encounter for health maintenance examination in adult -     Lipid panel -     TSH -     CBC -     Vitamin B12 -     CK  Allergic rhinitis, unspecified seasonality, unspecified trigger  Gastroesophageal reflux disease without esophagitis  Chronic bilateral low back pain with bilateral sciatica  Vaccine counseling  Screening for lipid disorders -     Lipid panel  Screening for cancer  Myalgia -     TSH -     CBC -     Vitamin B12 -     CK  Arm numbness -     TSH -     CBC -     Vitamin B12 -     CK     Follow-up pending labs, yearly for physical

## 2022-08-14 LAB — CBC
Hematocrit: 36.2 % (ref 34.0–46.6)
Hemoglobin: 12.2 g/dL (ref 11.1–15.9)
MCH: 29.5 pg (ref 26.6–33.0)
MCHC: 33.7 g/dL (ref 31.5–35.7)
MCV: 87 fL (ref 79–97)
Platelets: 288 10*3/uL (ref 150–450)
RBC: 4.14 x10E6/uL (ref 3.77–5.28)
RDW: 12.8 % (ref 11.7–15.4)
WBC: 5.1 10*3/uL (ref 3.4–10.8)

## 2022-08-14 LAB — LIPID PANEL
Chol/HDL Ratio: 2.8 ratio (ref 0.0–4.4)
Cholesterol, Total: 259 mg/dL — ABNORMAL HIGH (ref 100–199)
HDL: 94 mg/dL (ref >39)
LDL Chol Calc (NIH): 152 mg/dL — ABNORMAL HIGH (ref 0–99)
Triglycerides: 78 mg/dL (ref 0–149)
VLDL Cholesterol Cal: 13 mg/dL (ref 5–40)

## 2022-08-14 LAB — TSH: TSH: 0.973 u[IU]/mL (ref 0.450–4.500)

## 2022-08-14 LAB — CK: Total CK: 241 U/L — ABNORMAL HIGH (ref 32–182)

## 2022-08-14 LAB — VITAMIN B12: Vitamin B-12: 890 pg/mL (ref 232–1245)

## 2022-08-14 NOTE — Progress Notes (Signed)
Results sent through MyChart

## 2022-08-31 ENCOUNTER — Other Ambulatory Visit: Payer: Self-pay | Admitting: Medical

## 2022-08-31 MED ORDER — ROSUVASTATIN CALCIUM 10 MG PO TABS: 10.0000 mg | ORAL_TABLET | Freq: Every day | ORAL | 1 refills | Status: DC

## 2022-09-06 DIAGNOSIS — Z01419 Encounter for gynecological examination (general) (routine) without abnormal findings: Secondary | ICD-10-CM | POA: Diagnosis not present

## 2022-09-06 DIAGNOSIS — Z1231 Encounter for screening mammogram for malignant neoplasm of breast: Secondary | ICD-10-CM | POA: Diagnosis not present

## 2022-09-06 DIAGNOSIS — Z124 Encounter for screening for malignant neoplasm of cervix: Secondary | ICD-10-CM | POA: Diagnosis not present

## 2022-09-06 DIAGNOSIS — Z6828 Body mass index (BMI) 28.0-28.9, adult: Secondary | ICD-10-CM | POA: Diagnosis not present

## 2022-09-06 LAB — HM MAMMOGRAPHY

## 2022-09-19 LAB — HM PAP SMEAR
HM Pap smear: NEGATIVE
HPV, high-risk: NEGATIVE

## 2022-09-19 LAB — RESULTS CONSOLE HPV: CHL HPV: NEGATIVE

## 2022-10-10 ENCOUNTER — Telehealth: Payer: Self-pay | Admitting: Medical

## 2022-10-10 ENCOUNTER — Encounter: Payer: Self-pay | Admitting: Internal Medicine

## 2022-10-10 NOTE — Telephone Encounter (Signed)
Medical Records received from [facility]

## 2022-10-15 ENCOUNTER — Encounter: Payer: Self-pay | Admitting: Internal Medicine

## 2022-12-31 DIAGNOSIS — M1812 Unilateral primary osteoarthritis of first carpometacarpal joint, left hand: Secondary | ICD-10-CM | POA: Diagnosis not present

## 2023-02-27 DIAGNOSIS — M1612 Unilateral primary osteoarthritis, left hip: Secondary | ICD-10-CM | POA: Diagnosis not present

## 2023-02-27 DIAGNOSIS — M533 Sacrococcygeal disorders, not elsewhere classified: Secondary | ICD-10-CM | POA: Diagnosis not present

## 2023-02-27 DIAGNOSIS — M47896 Other spondylosis, lumbar region: Secondary | ICD-10-CM | POA: Diagnosis not present

## 2023-04-09 DIAGNOSIS — M47816 Spondylosis without myelopathy or radiculopathy, lumbar region: Secondary | ICD-10-CM | POA: Diagnosis not present

## 2023-05-28 ENCOUNTER — Ambulatory Visit: Payer: BC Managed Care – PPO | Admitting: Medical

## 2023-05-28 ENCOUNTER — Encounter: Payer: Self-pay | Admitting: Medical

## 2023-05-28 VITALS — BP 122/82 | HR 80 | Wt 156.0 lb

## 2023-05-28 DIAGNOSIS — M549 Dorsalgia, unspecified: Secondary | ICD-10-CM | POA: Diagnosis not present

## 2023-05-28 DIAGNOSIS — R5383 Other fatigue: Secondary | ICD-10-CM

## 2023-05-28 DIAGNOSIS — M2540 Effusion, unspecified joint: Secondary | ICD-10-CM | POA: Diagnosis not present

## 2023-05-28 DIAGNOSIS — M542 Cervicalgia: Secondary | ICD-10-CM | POA: Diagnosis not present

## 2023-05-28 DIAGNOSIS — M2550 Pain in unspecified joint: Secondary | ICD-10-CM | POA: Diagnosis not present

## 2023-05-28 DIAGNOSIS — G8929 Other chronic pain: Secondary | ICD-10-CM

## 2023-05-28 DIAGNOSIS — Z840 Family history of diseases of the skin and subcutaneous tissue: Secondary | ICD-10-CM

## 2023-05-28 NOTE — Progress Notes (Signed)
 Subjective:  Chief Complaint  [name] presents with   other    Dad was diagnosis with Lupus recently, arthritis hips neck and back, rt. Arm falls asleep and pain shoots down it, does happen on both arms, body has been achy,     Here for complaint of pain.  She notes for past 3 months has body pains.  Hurts in the arms, legs, knees, back, hips.  Her father was recently diagnosed with lupus so she is concerned about this.  She hurts all regular basis in multiple body parts including joints and muscles.  She feels morning stiffness and feels stiff anytime she gets still for little while.  Denies any recent fevers.  Her arms go numb at night in the sleep.  She has to turn and toss and hold the number arm to wake it back up.  She is a side sleeper.  Hot shower helps some.  Sometimes she does have hand pain and maybe some swelling in the hands there were none in.  No recent heavy activity or strenuous activity.  She was to be screened with some labs  She sees [facility] and has had prior multiple orthopedic procedures by that office  No other aggravating or relieving factors. No other complaint.    Past Medical History:  Diagnosis Date   Allergy    Atypical chest pain 02/11/2017   Bursitis of shoulder, right    Chronic back pain    since 2018   GERD (gastroesophageal reflux disease)    Heart murmur    as a child   Knee pain, bilateral    intermittent pain, ran track in high school; OA   Obesity    Routine gynecological examination    Dr. Henderson Cloud   Wisdom teeth extracted    Current Outpatient Medications on File Prior to Visit  Medication Sig Dispense Refill   methocarbamol (ROBAXIN-750) 750 MG tablet Take 1 tablet (750 mg total) by mouth 4 (four) times daily. 30 tablet 0   Multiple Vitamins-Minerals (HM MULTIVITAMIN ADULT GUMMY PO) Take 1 tablet by mouth daily.     rosuvastatin (CRESTOR) 10 MG tablet Take 1 tablet (10 mg total) by mouth daily. 90 tablet 1   tiZANidine  (ZANAFLEX) 4 MG tablet Take 1 tablet (4 mg total) by mouth at bedtime. 30 tablet 0   No current facility-administered medications on file prior to visit.   Past Surgical History:  Procedure Laterality Date   epidural steroid injection, lumbar spine  2019   FACET JOINT INJECTION  2019   KNEE ARTHROSCOPY WITH DRILLING/MICROFRACTURE Left 10/06/2013   Procedure: KNEE ARTHROSCOPY WITH DRILLING/MICROFRACTURE AND ALLOGRAFT CARTLAGE IMPLANTATION, CHONDRAPLASTY OF TROCHIAL AND PATELLA;  Surgeon: Cammy Copa, MD;  Location: [facility] OR;  Service: Orthopedics;  Laterality: Left;   KNEE SURGERY  1989   tendonitis, right knee;    TUBAL LIGATION      ROS as in subjective    Objective: BP 122/82   Pulse 80   Wt 156 lb (70.8 kg)   BMI 28.53 kg/m   Gen: wd, wn, nad There is some arthritic bony changes of the patella, no significant arthritic changes of the hands.  She is tender throughout multiple points of her arms and legs including some fibromyalgia trigger points of arms and legs. Shoulders tender to palpation and range of motion but no obvious laxity.  Range of motion seems full of arms and legs tender over her upper thighs and mid thighs.  No obvious joint swelling  throughout Back tender in the right lumbar paraspinal region.  She seems to be in pain with range of motion which is about 80% of normal with her back Arms and legs neurovascularly intact    Assessment: Encounter Diagnoses  Name Primary?   Polyarthralgia Yes   Joint swelling    Chronic back pain, unspecified back location, unspecified back pain laterality    Chronic neck pain    Family history of lupus erythematosus    Fatigue, unspecified type     Plan: We discussed her symptoms and concerns and questions about lupus and autoimmune disease.  She has certainly had a history of arthritis and degenerative disease of the lumbar spine.  We discussed rheumatoid conditions, differences between rheumatoid arthritis versus  osteoarthritis, discussed briefly how lupus can differ from other autoimmune issues.  We will check some screening labs today given her concern for autoimmune disease and lupus.  I may have her hold off on her statin drug short-term to see if that changes anything with her symptoms  We discussed using pillows and trying given positions and sleep to avoid arms going numb during the sleep.  Consider sleep study to check for sleep apnea  She has known prior degenerative changes in the back.  Follow-up with back doctor as needed.  Exercise and stretch regularly  Can use Tylenol for pain as needed.  Consider other remedies to help with pain control  Kevionna was seen today for other.  Diagnoses and all orders for this visit:  Polyarthralgia -     CK -     CYCLIC CITRUL PEPTIDE ANTIBODY, IGG/IGA -     Sedimentation rate -     Uric acid -     Rheumatoid factor -     CBC -     ANA -     TSH  Joint swelling -     CK -     CYCLIC CITRUL PEPTIDE ANTIBODY, IGG/IGA -     Sedimentation rate -     Uric acid -     Rheumatoid factor -     CBC -     ANA -     TSH  Chronic back pain, unspecified back location, unspecified back pain laterality -     CK -     CYCLIC CITRUL PEPTIDE ANTIBODY, IGG/IGA -     Sedimentation rate -     Uric acid -     Rheumatoid factor -     CBC -     ANA -     TSH  Chronic neck pain -     CK -     CYCLIC CITRUL PEPTIDE ANTIBODY, IGG/IGA -     Sedimentation rate -     Uric acid -     Rheumatoid factor -     CBC -     ANA -     TSH  Family history of lupus erythematosus -     CK -     CYCLIC CITRUL PEPTIDE ANTIBODY, IGG/IGA -     Sedimentation rate -     Uric acid -     Rheumatoid factor -     CBC -     ANA -     TSH  Fatigue, unspecified type -     CK -     CYCLIC CITRUL PEPTIDE ANTIBODY, IGG/IGA -     Sedimentation rate -     Uric acid -     Rheumatoid  factor -     CBC -     ANA -     TSH   Follow-up pending labs

## 2023-05-29 ENCOUNTER — Other Ambulatory Visit: Payer: Self-pay | Admitting: Medical

## 2023-05-29 DIAGNOSIS — M2550 Pain in unspecified joint: Secondary | ICD-10-CM

## 2023-05-29 DIAGNOSIS — R748 Abnormal levels of other serum enzymes: Secondary | ICD-10-CM

## 2023-05-29 DIAGNOSIS — R700 Elevated erythrocyte sedimentation rate: Secondary | ICD-10-CM

## 2023-05-29 MED ORDER — TIZANIDINE HCL 4 MG PO TABS: 4.0000 mg | ORAL_TABLET | Freq: Every day | ORAL | 2 refills | Status: DC

## 2023-05-29 MED ORDER — PREDNISONE 10 MG PO TABS: ORAL_TABLET | ORAL | 0 refills | Status: DC

## 2023-05-29 NOTE — Progress Notes (Signed)
 Not all of your labs are back yet.  But so far your CK marker for muscle inflammation is elevated your sed rate marker for inflammation is elevated.  Your uric acid marker is normal, blood counts normal, thyroid normal  Temporarily stop your cholesterol medicine in case that is playing any kind of role  Begin a 7-day course of prednisone steroid as sent to the pharmacy.  I also sent tizanidine muscle relaxer you can use once or twice daily as needed for muscle tightness soreness or spasm, but caution with sedation.  Also I believe you saw a rheumatologist for 5 years ago, arranged rheumatology.  If agreeable I recommend a consult back with him given these findings

## 2023-05-31 LAB — CK: Total CK: 268 U/L — ABNORMAL HIGH (ref 32–182)

## 2023-05-31 LAB — CBC
Hematocrit: 37.9 % (ref 34.0–46.6)
Hemoglobin: 12.2 g/dL (ref 11.1–15.9)
MCH: 28.8 pg (ref 26.6–33.0)
MCHC: 32.2 g/dL (ref 31.5–35.7)
MCV: 90 fL (ref 79–97)
Platelets: 304 10*3/uL (ref 150–450)
RBC: 4.23 x10E6/uL (ref 3.77–5.28)
RDW: 12.7 % (ref 11.7–15.4)
WBC: 5.2 10*3/uL (ref 3.4–10.8)

## 2023-05-31 LAB — URIC ACID: Uric Acid: 4.5 mg/dL (ref 3.0–7.2)

## 2023-05-31 LAB — RHEUMATOID FACTOR: Rheumatoid fact SerPl-aCnc: 10 [IU]/mL (ref <14.0)

## 2023-05-31 LAB — SEDIMENTATION RATE: Sed Rate: 52 mm/h — ABNORMAL HIGH (ref 0–40)

## 2023-05-31 LAB — ANA

## 2023-05-31 LAB — TSH: TSH: 1.44 u[IU]/mL (ref 0.450–4.500)

## 2023-05-31 LAB — CYCLIC CITRUL PEPTIDE ANTIBODY, IGG/IGA: Cyclic Citrullin Peptide Ab: 5 U (ref 0–19)

## 2023-06-03 NOTE — Progress Notes (Signed)
 Results sent through MyChart

## 2023-06-04 ENCOUNTER — Other Ambulatory Visit: Payer: Self-pay | Admitting: Medical

## 2023-06-05 ENCOUNTER — Encounter (HOSPITAL_BASED_OUTPATIENT_CLINIC_OR_DEPARTMENT_OTHER): Payer: Self-pay

## 2023-06-05 ENCOUNTER — Emergency Department (HOSPITAL_BASED_OUTPATIENT_CLINIC_OR_DEPARTMENT_OTHER): Payer: BC Managed Care – PPO | Admitting: Radiology

## 2023-06-05 ENCOUNTER — Other Ambulatory Visit: Payer: Self-pay

## 2023-06-05 DIAGNOSIS — R0789 Other chest pain: Secondary | ICD-10-CM | POA: Insufficient documentation

## 2023-06-05 DIAGNOSIS — M791 Myalgia, unspecified site: Secondary | ICD-10-CM | POA: Insufficient documentation

## 2023-06-05 DIAGNOSIS — M25511 Pain in right shoulder: Secondary | ICD-10-CM | POA: Diagnosis not present

## 2023-06-05 DIAGNOSIS — R079 Chest pain, unspecified: Secondary | ICD-10-CM | POA: Diagnosis not present

## 2023-06-05 LAB — BASIC METABOLIC PANEL
Anion gap: 8 (ref 5–15)
BUN: 16 mg/dL (ref 6–20)
CO2: 30 mmol/L (ref 22–32)
Calcium: 9.7 mg/dL (ref 8.9–10.3)
Chloride: 104 mmol/L (ref 98–111)
Creatinine, Ser: 0.93 mg/dL (ref 0.44–1.00)
GFR, Estimated: >60 mL/min (ref >60)
Glucose, Bld: 114 mg/dL — ABNORMAL HIGH (ref 70–99)
Potassium: 3.4 mmol/L — ABNORMAL LOW (ref 3.5–5.1)
Sodium: 142 mmol/L (ref 135–145)

## 2023-06-05 LAB — CBC
HCT: 39.5 % (ref 36.0–46.0)
Hemoglobin: 12.6 g/dL (ref 12.0–15.0)
MCH: 28.8 pg (ref 26.0–34.0)
MCHC: 31.9 g/dL (ref 30.0–36.0)
MCV: 90.2 fL (ref 80.0–100.0)
Platelets: 305 10*3/uL (ref 150–400)
RBC: 4.38 MIL/uL (ref 3.87–5.11)
RDW: 13.3 % (ref 11.5–15.5)
WBC: 7.7 10*3/uL (ref 4.0–10.5)
nRBC: 0 % (ref 0.0–0.2)

## 2023-06-05 LAB — TROPONIN I (HIGH SENSITIVITY): Troponin I (High Sensitivity): <2 ng/L (ref <18)

## 2023-06-05 NOTE — ED Triage Notes (Signed)
 Constant mid chest pain radiating to right arm that has been all day. Pt describes it as indigestion.

## 2023-06-06 ENCOUNTER — Emergency Department (HOSPITAL_BASED_OUTPATIENT_CLINIC_OR_DEPARTMENT_OTHER)
Admission: EM | Admit: 2023-06-06 | Discharge: 2023-06-06 | Disposition: A | Discharge status: Home / Self Care | Payer: BC Managed Care – PPO | Attending: Emergency Medicine | Admitting: Emergency Medicine

## 2023-06-06 DIAGNOSIS — M791 Myalgia, unspecified site: Secondary | ICD-10-CM

## 2023-06-06 LAB — TROPONIN I (HIGH SENSITIVITY): Troponin I (High Sensitivity): <2 ng/L (ref <18)

## 2023-06-06 LAB — CK: Total CK: 63 U/L (ref 38–234)

## 2023-06-06 MED ORDER — PREDNISONE 20 MG PO TABS: ORAL_TABLET | ORAL | 0 refills | Status: DC

## 2023-06-06 MED ORDER — PREDNISONE 50 MG PO TABS
60.0000 mg | ORAL_TABLET | Freq: Once | ORAL | Status: AC
  Administered 2023-06-06: 60 mg via ORAL
  Filled 2023-06-06: qty 1

## 2023-06-08 NOTE — ED Provider Notes (Signed)
 [place] [facility] AT [facility] Provider Note   CSN: [id number] Arrival date & time: 06/05/23  2108     History  Chief Complaint  Patient presents with   Chest Pain    [name] is a 58 y.o. female.  Pain in right mid chest radiating to right shoulder. Present all day. Reproducible with certain movements but made worse with exertion. No associated nausea, diaphoresis, dyspnea or light headedness. Feels like episodes of indigestion in the past.   Chest Pain      Home Medications Prior to Admission medications   Medication Sig Start Date End Date Taking? Authorizing Provider  predniSONE (DELTASONE) 20 MG tablet 3 tabs po daily x 3 days, then 2 tabs x 3 days, then 1.5 tabs x 3 days, then 1 tab x 3 days, then 0.5 tabs x 3 days 06/06/23  Yes Vian Fluegel, Barbara Cower, MD  Multiple Vitamins-Minerals (HM MULTIVITAMIN ADULT GUMMY PO) Take 1 tablet by mouth daily.    [provider]  rosuvastatin (CRESTOR) 10 MG tablet Take 1 tablet (10 mg total) by mouth daily. 08/31/22 08/31/23  Tysinger, [name], PA-C  tiZANidine (ZANAFLEX) 4 MG tablet Take 1 tablet (4 mg total) by mouth at bedtime. 05/29/23   Tysinger, [name], PA-C      Allergies    Patient has no known allergies.    Review of Systems   Review of Systems  Cardiovascular:  Positive for chest pain.    Physical Exam Updated Vital Signs BP (!) 128/95   Pulse 67   Temp 98.1 F (36.7 C)   Resp (!) 21   Ht 5' 8.5" (1.74 m)   Wt 71.7 kg   SpO2 100%   BMI 23.67 kg/m  Physical Exam Vitals and nursing note reviewed.  Constitutional:      Appearance: She is well-developed.  HENT:     Head: Normocephalic and atraumatic.  Cardiovascular:     Rate and Rhythm: Normal rate and regular rhythm.  Pulmonary:     Effort: No respiratory distress.     Breath sounds: No stridor.  Abdominal:     General: There is no distension.  Musculoskeletal:     Cervical back: Normal range of motion.   Neurological:     Mental Status: She is alert.     ED Results / Procedures / Treatments   Labs (all labs ordered are listed, but only abnormal results are displayed) Labs Reviewed  BASIC METABOLIC PANEL - Abnormal; Notable for the following components:      Result Value   Potassium 3.4 (*)    Glucose, Bld 114 (*)    All other components within normal limits  CBC  CK  TROPONIN I (HIGH SENSITIVITY)  TROPONIN I (HIGH SENSITIVITY)    EKG EKG Interpretation Date/Time:  Wednesday June 05 2023 21:20:41 EST Ventricular Rate:  70 PR Interval:  130 QRS Duration:  78 QT Interval:  388 QTC Calculation: 419 R Axis:   5  Text Interpretation: Normal sinus rhythm Cannot rule out Anterior infarct , age undetermined ST & T wave abnormality, consider lateral ischemia Abnormal ECG When compared with ECG of 05-Mar-2017 15:30, PREVIOUS ECG IS PRESENT Confirmed by Marily Memos [phone]) on 06/06/2023 1:40:23 AM  Radiology No results found.  Procedures Procedures    Medications Ordered in ED Medications  predniSONE (DELTASONE) tablet 60 mg (60 mg Oral Given 06/06/23 0210)    ED Course/ Medical Decision Making/ A&P  Medical Decision Making Amount and/or Complexity of Data Reviewed Labs: ordered. Radiology: ordered.  Risk Prescription drug management.   58 yo F here with chest pain that is reproducible with palpation and movement. Suspect MSK? Trponins negative. Ck negative. Doubt PE with normal vs and minimal RF's. No rash. No other associatied symptoms to suggest emergent causes requiring hospitalization or further workup in the ER.   Final Clinical Impression(s) / ED Diagnoses Final diagnoses:  Muscular pain    Rx / DC Orders ED Discharge Orders          Ordered    predniSONE (DELTASONE) 20 MG tablet        06/06/23 0258              Haven Pylant, Barbara Cower, MD 06/08/23 [phone]

## 2023-07-01 DIAGNOSIS — M25551 Pain in right hip: Secondary | ICD-10-CM | POA: Diagnosis not present

## 2023-07-01 DIAGNOSIS — M25552 Pain in left hip: Secondary | ICD-10-CM | POA: Diagnosis not present

## 2023-07-16 DIAGNOSIS — M5412 Radiculopathy, cervical region: Secondary | ICD-10-CM | POA: Diagnosis not present

## 2023-07-27 DIAGNOSIS — M50121 Cervical disc disorder at C4-C5 level with radiculopathy: Secondary | ICD-10-CM | POA: Diagnosis not present

## 2023-07-27 DIAGNOSIS — M50122 Cervical disc disorder at C5-C6 level with radiculopathy: Secondary | ICD-10-CM | POA: Diagnosis not present

## 2023-08-06 DIAGNOSIS — M25511 Pain in right shoulder: Secondary | ICD-10-CM | POA: Diagnosis not present

## 2023-08-14 ENCOUNTER — Encounter: Payer: Self-pay | Admitting: Medical

## 2023-08-14 ENCOUNTER — Ambulatory Visit (INDEPENDENT_AMBULATORY_CARE_PROVIDER_SITE_OTHER): Payer: BC Managed Care – PPO | Admitting: Medical

## 2023-08-14 VITALS — BP 110/70 | HR 60 | Ht 61.75 in | Wt 153.4 lb

## 2023-08-14 DIAGNOSIS — Z13220 Encounter for screening for lipoid disorders: Secondary | ICD-10-CM | POA: Diagnosis not present

## 2023-08-14 DIAGNOSIS — M2550 Pain in unspecified joint: Secondary | ICD-10-CM

## 2023-08-14 DIAGNOSIS — Z0000 Encounter for general adult medical examination without abnormal findings: Secondary | ICD-10-CM

## 2023-08-14 DIAGNOSIS — M25511 Pain in right shoulder: Secondary | ICD-10-CM

## 2023-08-14 DIAGNOSIS — Z136 Encounter for screening for cardiovascular disorders: Secondary | ICD-10-CM | POA: Diagnosis not present

## 2023-08-14 DIAGNOSIS — E569 Vitamin deficiency, unspecified: Secondary | ICD-10-CM | POA: Insufficient documentation

## 2023-08-14 DIAGNOSIS — Z1211 Encounter for screening for malignant neoplasm of colon: Secondary | ICD-10-CM

## 2023-08-14 DIAGNOSIS — G8929 Other chronic pain: Secondary | ICD-10-CM

## 2023-08-14 NOTE — Progress Notes (Signed)
 Subjective:   HPI  [name] is a 58 y.o. female who presents for Chief Complaint  Patient presents with   Annual Exam    Fasting cpe, dealing with Arthritis, sees [facility] .     Patient Care Team: Cecely Rengel, Newt Barefoot as PCP - General (Family Medicine) Ob/Gyn, Darleene Ege, [facility] (Neurosurgery) Sees dentist Sees eye doctor Dr. Tami Falcon, GI Dr. Adelaide Adjutant, ortho Dr. Adonica Hoose, ortho Dr. Cloyce Darby, podiatry Dr. Zenia Hight, gynecology [facility]  Concerns: Here for well visit.  She continues to complain of numerous joint pains.  She sees orthopedics at [facility].  She is seeing a specialist for her neck and arm.  She saw a rheumatology for consult and they felt like there was nothing they could do for her as she did not have a rheumatoid issue.  Specifically for the last few weeks she has had right shoulder pain posteriorly and pain with overhead motion.  Uses a arm splint sometimes.  Is right-handed.  Wants advice on what she can use  to help with her ongoing joint pains  Reviewed their medical, surgical, family, social, medication, and allergy history and updated chart as appropriate.  Past Medical History:  Diagnosis Date   Allergy    Atypical chest pain 02/11/2017   Bursitis of shoulder, right    Chronic back pain    since 2018   GERD (gastroesophageal reflux disease)    Heart murmur    as a child   Knee pain, bilateral    intermittent pain, ran track in high school; OA   Obesity    Routine gynecological examination    Dr. Leslee Rase   Wisdom teeth extracted     Family History  Problem Relation Age of Onset   Hypertension Mother    Kidney disease Mother        hypertensive kidney failure, renal transplant   Diabetes Mother    Heart disease Mother 28       CHF   Heart failure Mother    Stroke Mother    Arthritis Father    Hypertension Father     Sleep apnea Father    Cancer Father        leukemia   Fibroids Sister    Hypertension Sister    Past Surgical History:  Procedure Laterality Date   COLONOSCOPY  03/2019   Dr. Tami Falcon, repeat 7-10 years   epidural steroid injection, lumbar spine  2019   FACET JOINT INJECTION  2019   KNEE ARTHROSCOPY WITH DRILLING/MICROFRACTURE Left 10/06/2013   Procedure: KNEE ARTHROSCOPY WITH DRILLING/MICROFRACTURE AND ALLOGRAFT CARTLAGE IMPLANTATION, CHONDRAPLASTY OF TROCHIAL AND PATELLA;  Surgeon: Jasmine Mesi, MD;  Location: [facility] OR;  Service: Orthopedics;  Laterality: Left;   KNEE SURGERY  1989   tendonitis, right knee;    TOTAL HIP ARTHROPLASTY  2023   left   TUBAL LIGATION       Current Outpatient Medications:    Multiple Vitamins-Minerals (HM MULTIVITAMIN ADULT GUMMY PO), Take 1 tablet by mouth daily., Disp: , Rfl:   No Known Allergies  Review of Systems  Constitutional:  Negative for chills, fever, malaise/fatigue and weight loss.  HENT:  Negative for congestion, ear pain, hearing loss, sore throat and tinnitus.   Eyes:  Negative for blurred vision, pain and redness.  Respiratory:  Negative for cough, hemoptysis and shortness of breath.   Cardiovascular:  Negative for chest pain, palpitations,  orthopnea, claudication and leg swelling.  Gastrointestinal:  Negative for abdominal pain, blood in stool, constipation, diarrhea, nausea and vomiting.  Genitourinary:  Negative for dysuria, flank pain, frequency, hematuria and urgency.  Musculoskeletal:  Positive for joint pain. Negative for falls and myalgias.  Skin:  Negative for itching and rash.  Neurological:  Negative for dizziness, tingling, speech change, weakness and headaches.  Endo/Heme/Allergies:  Negative for polydipsia. Does not bruise/bleed easily.  Psychiatric/Behavioral:  Negative for depression and memory loss. The patient is not nervous/anxious and does not have insomnia.         08/14/2023   10:16 AM 08/13/2022     1:36 PM 08/08/2021   12:07 PM 02/06/2021    8:42 AM 05/23/2020    2:59 PM  Depression screen PHQ 2/9  Decreased Interest 0 0 3 0 0  Down, Depressed, Hopeless 0 0 3 0 0  PHQ - 2 Score 0 0 6 0 0  Altered sleeping   0    Tired, decreased energy   3    Change in appetite   3    Feeling bad or failure about yourself    0    Trouble concentrating   3    Moving slowly or fidgety/restless   3    Suicidal thoughts   0    PHQ-9 Score   18    Difficult doing work/chores   Not difficult at all         Objective:  BP 110/70   Pulse 60   Ht 5' 1.75" (1.568 m)   Wt 153 lb 6.4 oz (69.6 kg)   BMI 28.28 kg/m   Wt Readings from Last 3 Encounters:  08/14/23 153 lb 6.4 oz (69.6 kg)  06/05/23 158 lb (71.7 kg)  05/28/23 156 lb (70.8 kg)   General appearance: alert, no distress, WD/WN, African American female Skin: Unremarkable HEENT: normocephalic, conjunctiva/corneas normal, sclerae anicteric, PERRLA, EOMi, nares patent, no discharge or erythema, pharynx normal Oral cavity: MMM, tongue normal, teeth in good repair Neck: supple, no lymphadenopathy, no thyromegaly, no masses, normal ROM, no bruits Chest: non tender, normal shape and expansion Heart: RRR, normal S1, S2, no murmurs Lungs: CTA bilaterally, no wheezes, rhonchi, or rales Abdomen: +bs, soft, non tender, non distended, no masses, no hepatomegaly, no splenomegaly, no bruits Back: non tender, normal ROM, no scoliosis Musculoskeletal:  tender right posterior shoulder, pain with any motion over 90 degrees, passive and active, otherwise UE unremarkable  No specific joint swelling seen, nontender in the typical fibromyalgia tender spots, otherwise limited exam without obvious deformity  Extremities: no edema, no cyanosis, no clubbing Pulses: 2+ symmetric, upper and lower extremities, normal cap refill Neurological:-phalens and tinels, alert, oriented x 3, CN2-12 intact, strength normal upper extremities and lower extremities, sensation normal  throughout, DTRs 2+ throughout, no cerebellar signs, gait normal Psychiatric: normal affect, behavior normal, pleasant  Breast/gyn/rectal - deferred to gynecology     Assessment and Plan :   Encounter Diagnoses  Name Primary?   Encounter for health maintenance examination in adult Yes   Chronic right shoulder pain    Encounter for lipid screening for cardiovascular disease    Vitamin deficiency    Screening for heart disease    Polyarthralgia     This visit was a preventative care visit, also known as wellness visit or routine physical.   Topics typically include healthy lifestyle, diet, exercise, preventative care, vaccinations, sick and well care, proper use of emergency dept and after hours  care, as well as other concerns.     Recommendations: Continue to return yearly for your annual wellness and preventative care visits.  This gives us  a chance to discuss healthy lifestyle, exercise, vaccinations, review your chart record, and perform screenings where appropriate.  I recommend you see your eye doctor yearly for routine vision care.  I recommend you see your dentist yearly for routine dental care including hygiene visits twice yearly.  See your gynecologist yearly for routine gynecological care.   Vaccination recommendations were reviewed Immunization History  Administered Date(s) Administered   Hepatitis B, ADULT 02/29/2016, 03/30/2016, 08/29/2016   Influenza,inj,Quad PF,6+ Mos 11/18/2014, 12/30/2015, 12/31/2016, 01/01/2018, 12/25/2018, 01/06/2020, 02/23/2021, 12/12/2021   Moderna SARS-COV2 Booster Vaccination 02/18/2020   Moderna Sars-Covid-2 Vaccination 06/04/2019, 07/07/2019   PPD Test 01/02/2016   Tdap 05/23/2020   Zoster Recombinant(Shingrix) 04/30/2022, 06/30/2022   Consider Prevnar 20 vaccine   Screening for cancer: Colon cancer screening: I reviewed your colonoscopy on file that is up to date from 2020, repeat 7- 10 years Will send home with updated FIT  test today  Breast cancer screening: You should perform a self breast exam monthly.   We reviewed recommendations for regular mammograms and breast cancer screening.  Cervical cancer screening: Pap smear reviewed from 2024 up to date  Skin cancer screening: Check your skin regularly for new changes, growing lesions, or other lesions of concern Come in for evaluation if you have skin lesions of concern.  Lung cancer screening: If you have a greater than 20 pack year history of tobacco use, then you may qualify for lung cancer screening with a chest CT scan.   Please call your insurance company to inquire about coverage for this test.  We currently don't have screenings for other cancers besides breast, cervical, colon, and lung cancers.  If you have a strong family history of cancer or have other cancer screening concerns, please let me know.    Bone health: Get at least 150 minutes of aerobic exercise weekly Get weight bearing exercise at least once weekly Bone density test:  A bone density test is an imaging test that uses a type of X-ray to measure the amount of calcium  and other minerals in your bones. The test may be used to diagnose or screen you for a condition that causes weak or thin bones (osteoporosis), predict your risk for a broken bone (fracture), or determine how well your osteoporosis treatment is working. The bone density test is recommended for females 65 and older, or females or males <65 if certain risk factors such as thyroid  disease, long term use of steroids such as for asthma or rheumatological issues, vitamin D  deficiency, estrogen deficiency, family history of osteoporosis, self or family history of fragility fracture in first degree relative.    Heart health: Get at least 150 minutes of aerobic exercise weekly Limit alcohol It is important to maintain a healthy blood pressure and healthy cholesterol numbers  Heart disease screening: Screening for heart  disease includes screening for blood pressure, fasting lipids, glucose/diabetes screening, BMI height to weight ratio, reviewed of smoking status, physical activity, and diet.    Goals include blood pressure 120/80 or less, maintaining a healthy lipid/cholesterol profile, preventing diabetes or keeping diabetes numbers under good control, not smoking or using tobacco products, exercising most days per week or at least 150 minutes per week of exercise, and eating healthy variety of fruits and vegetables, healthy oils, and avoiding unhealthy food choices like fried food, fast food, high  sugar and high cholesterol foods.    Other tests may possibly include EKG test, CT coronary calcium  score, echocardiogram, exercise treadmill stress test.   Consider CT coronary test/screening.  Order placed after discussion today    Medical care options: I recommend you continue to seek care here first for routine care.  We try really hard to have available appointments Monday through Friday daytime hours for sick visits, acute visits, and physicals.  Urgent care should be used for after hours and weekends for significant issues that cannot wait till the next day.  The emergency department should be used for significant potentially life-threatening emergencies.  The emergency department is expensive, can often have long wait times for less significant concerns, so try to utilize primary care, urgent care, or telemedicine when possible to avoid unnecessary trips to the emergency department.  Virtual visits and telemedicine have been introduced since the pandemic started in 2020, and can be convenient ways to receive medical care.  We offer virtual appointments as well to assist you in a variety of options to seek medical care.   Separate significant issues discussed: Right shoulder pain-likely rotator cuff tendinitis and may be some bursitis as well.  Referral to physical therapy.  Advised 1 week of rest, arm sling use  periodically, cold therapy at least twice daily, short-term over-the-counter Aleve  once or twice a day and once pain improves she can work on some stretches and therapy exercises.    Amahia was seen today for annual exam.  Diagnoses and all orders for this visit:  Encounter for health maintenance examination in adult -     Comprehensive metabolic panel with GFR -     Lipid panel -     VITAMIN D  25 Hydroxy (Vit-D Deficiency, Fractures)  Chronic right shoulder pain -     Ambulatory referral to Physical Therapy  Encounter for lipid screening for cardiovascular disease -     Lipid panel  Vitamin deficiency -     VITAMIN D  25 Hydroxy (Vit-D Deficiency, Fractures)  Screening for heart disease  Polyarthralgia      Follow-up pending labs, yearly for physical

## 2023-08-15 DIAGNOSIS — M25511 Pain in right shoulder: Secondary | ICD-10-CM | POA: Diagnosis not present

## 2023-08-15 LAB — COMPREHENSIVE METABOLIC PANEL WITH GFR
ALT: 16 IU/L (ref 0–32)
AST: 17 IU/L (ref 0–40)
Albumin: 4.6 g/dL (ref 3.8–4.9)
Alkaline Phosphatase: 82 IU/L (ref 44–121)
BUN/Creatinine Ratio: 20 (ref 9–23)
BUN: 17 mg/dL (ref 6–24)
Bilirubin Total: <0.2 mg/dL (ref 0.0–1.2)
CO2: 23 mmol/L (ref 20–29)
Calcium: 10 mg/dL (ref 8.7–10.2)
Chloride: 103 mmol/L (ref 96–106)
Creatinine, Ser: 0.83 mg/dL (ref 0.57–1.00)
Globulin, Total: 3.2 g/dL (ref 1.5–4.5)
Glucose: 85 mg/dL (ref 70–99)
Potassium: 4.3 mmol/L (ref 3.5–5.2)
Sodium: 141 mmol/L (ref 134–144)
Total Protein: 7.8 g/dL (ref 6.0–8.5)
eGFR: 83 mL/min/{1.73_m2} (ref >59)

## 2023-08-15 LAB — LIPID PANEL
Chol/HDL Ratio: 2.7 ratio (ref 0.0–4.4)
Cholesterol, Total: 216 mg/dL — ABNORMAL HIGH (ref 100–199)
HDL: 80 mg/dL (ref >39)
LDL Chol Calc (NIH): 124 mg/dL — ABNORMAL HIGH (ref 0–99)
Triglycerides: 66 mg/dL (ref 0–149)
VLDL Cholesterol Cal: 12 mg/dL (ref 5–40)

## 2023-08-15 LAB — VITAMIN D 25 HYDROXY (VIT D DEFICIENCY, FRACTURES): Vit D, 25-Hydroxy: 55.5 ng/mL (ref 30.0–100.0)

## 2023-08-15 NOTE — Progress Notes (Signed)
 Results sent through MyChart

## 2023-08-27 ENCOUNTER — Ambulatory Visit (HOSPITAL_COMMUNITY)
Admission: RE | Admit: 2023-08-27 | Discharge: 2023-08-27 | Disposition: A | Discharge status: Home / Self Care | Payer: Self-pay | Source: Ambulatory Visit | Attending: Medical | Admitting: Medical

## 2023-08-27 DIAGNOSIS — Z136 Encounter for screening for cardiovascular disorders: Secondary | ICD-10-CM | POA: Insufficient documentation

## 2023-08-28 ENCOUNTER — Ambulatory Visit: Payer: Self-pay | Admitting: Medical

## 2023-08-28 NOTE — Progress Notes (Signed)
 Results sent through MyChart

## 2023-09-16 DIAGNOSIS — Z1231 Encounter for screening mammogram for malignant neoplasm of breast: Secondary | ICD-10-CM | POA: Diagnosis not present

## 2023-09-16 DIAGNOSIS — Z01419 Encounter for gynecological examination (general) (routine) without abnormal findings: Secondary | ICD-10-CM | POA: Diagnosis not present

## 2023-11-25 DIAGNOSIS — M19011 Primary osteoarthritis, right shoulder: Secondary | ICD-10-CM | POA: Diagnosis not present

## 2023-12-05 ENCOUNTER — Telehealth (INDEPENDENT_AMBULATORY_CARE_PROVIDER_SITE_OTHER): Admitting: Medical

## 2023-12-05 ENCOUNTER — Ambulatory Visit: Payer: Self-pay

## 2023-12-05 VITALS — Wt 150.0 lb

## 2023-12-05 DIAGNOSIS — H1032 Unspecified acute conjunctivitis, left eye: Secondary | ICD-10-CM

## 2023-12-05 MED ORDER — ERYTHROMYCIN 5 MG/GM OP OINT: 1.0000 | TOPICAL_OINTMENT | Freq: Every day | OPHTHALMIC | 0 refills | Status: AC

## 2023-12-05 NOTE — Telephone Encounter (Signed)
 FYI Only or Action Required?: FYI only for provider.  Patient was last seen in primary care on 08/14/2023 by Carrie Alm RAMAN, PA-C.  Called Nurse Triage reporting Itchy Eye and Conjunctivitis.  Symptoms began yesterday.  Interventions attempted: Nothing.  Symptoms are: red sclera with itchiness in left eye with white pus discharge gradually worsening.  Triage Disposition: See PCP When Office is Open (Within 3 Days)  Patient/caregiver understands and will follow disposition?: Yes             Copied from CRM [id number]. Topic: Clinical - Red Word Triage >> Dec 05, 2023  8:19 AM [name] wrote: Red Word that prompted transfer to Nurse Triage: Patient called has the pink eye its burning and very itchy. A little uncomfortable and very red. Started last night. Would like some drops called in if possible Reason for Disposition  [1] Only 1 eye is red AND [2] present > 48 hours  Answer Assessment - Initial Assessment Questions 1. LOCATION: Location: What's red, the eyeball or the outer eyelids? (Note: when callers say the eye is red, they usually mean the sclera is red)       Left eye. Sclera is red.  2. REDNESS OF SCLERA: Is the redness in one or both eyes? When did the redness start?      Just the left eye, started last night.  3. ONSET: When did the eye become red? (e.g., hours, days)      Last night. She states yesterday it felt like she had a film in her eye and was frequently blinking.  4. EYELIDS: Are the eyelids red or swollen? If Yes, ask: How much?      No.  5. VISION: Is there any difficulty seeing clearly?      No. She states there is a sensation that there is a film in her eye.  6. ITCHING: Does it feel itchy? If so ask: How bad is it (e.g., Scale 1-10; or mild, moderate, severe)     Yes, mild.  7. PAIN: Is there any pain? If Yes, ask: How bad is it? (e.g., Scale 1-10; or mild, moderate, severe)     No.  8. CONTACT LENS: Do you wear  contacts?     No, she wears glasses.  9. CAUSE: What do you think is causing the redness?     Pink eye.  10. OTHER SYMPTOMS: Do you have any other symptoms? (e.g., fever, runny nose, cough, vomiting)       White pus near tear duct this morning when she woke up. Denies fever, runny nose, sneezing, cough, headaches, vomiting, recent eye injury.  Protocols used: Eye - Red Without Pus-A-AH

## 2023-12-05 NOTE — Progress Notes (Signed)
 Subjective:     Patient ID: [name], female   DOB: 04-04-1966, 58 y.o.   MRN: [id number]  This visit type was conducted due to national recommendations for restrictions regarding the COVID-19 Pandemic (e.g. social distancing) in an effort to limit this patient's exposure and mitigate transmission in our community.  Due to their co-morbid illnesses, this patient is at least at moderate risk for complications without adequate follow up.  This format is felt to be most appropriate for this patient at this time.    Documentation for virtual audio and video telecommunications through [place] encounter:  The patient was located at home. The provider was located in the office. The patient did consent to this visit and is aware of possible charges through their insurance for this visit.  The other persons participating in this telemedicine service were none. Time spent on call was 20 minutes and in review of previous records 20 minutes total.  This virtual service is not related to other E/M service within previous 7 days.   HPI Chief Complaint  Patient presents with   Acute Visit    Left ear irritation. Feels like a film over eye. Itchy, white pus, crusty. Started last night    Virtual for eye complaint.  She reports 1 day history of possible pinkeye.  She notes reddish nests in the left eye, eye irritation, feels like a film over the eye, itchy, some pus drainage, some crusting this morning.  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  No specific sick contact.  No pain, no vision loss.  No recent injury or trauma.  Otherwise normal state of health.  No fever.  No other symptoms.  Past Medical History:  Diagnosis Date   Allergy    Atypical chest pain 02/11/2017   Bursitis of shoulder, right    Chronic back pain    since 2018   GERD (gastroesophageal reflux disease)    Heart murmur    as a child   Knee pain,  bilateral    intermittent pain, ran track in high school; OA   Obesity    Routine gynecological examination    Dr. Sarrah   Wisdom teeth extracted    Current Outpatient Medications on File Prior to Visit  Medication Sig Dispense Refill   Multiple Vitamins-Minerals (HM MULTIVITAMIN ADULT GUMMY PO) Take 1 tablet by mouth daily.     No current facility-administered medications on file prior to visit.     Review of Systems As in subjective    Objective:   Physical Exam Due to coronavirus pandemic stay at home measures, patient visit was virtual and they were not examined in person.   Wt 150 lb (68 kg)   BMI 27.66 kg/m   Gen: wd, wn, nad Limited exam virtually. There is seems to be some injection of the right conjunctiva but no diffuse redness throughout the sclera.  She does not seem to be irritated or in pain      Assessment:     Encounter Diagnosis  Name Primary?   Acute conjunctivitis of left eye, unspecified acute conjunctivitis type Yes       Plan:      Medications prescribed today: Romycin ointment  Discussed diagnosis of conjunctivitis/pink eye.  Advised that pink eye is very contagious and spreads by direct contact.  Discussed treatment including moist compresses, avoid rubbing eyes, do not wear contact lenses or makeup until  infection is resolved.  Discussed prevention, hand washing, not rubbing eyes.    Patient was advised to call or return if worse or not improving in the next few days.    Patient voiced understanding of diagnosis, recommendations, and treatment plan.   [name] was seen today for acute visit.  Diagnoses and all orders for this visit:  Acute conjunctivitis of left eye, unspecified acute conjunctivitis type  Other orders -     erythromycin  ophthalmic ointment; Place 1 Application into the left eye at bedtime.  Prn

## 2024-01-21 DIAGNOSIS — M1611 Unilateral primary osteoarthritis, right hip: Secondary | ICD-10-CM | POA: Diagnosis not present

## 2024-02-23 DIAGNOSIS — M25551 Pain in right hip: Secondary | ICD-10-CM | POA: Diagnosis not present

## 2024-03-02 DIAGNOSIS — M1611 Unilateral primary osteoarthritis, right hip: Secondary | ICD-10-CM | POA: Diagnosis not present

## 2024-03-04 DIAGNOSIS — M25551 Pain in right hip: Secondary | ICD-10-CM | POA: Diagnosis not present

## 2024-08-19 ENCOUNTER — Encounter: Payer: Self-pay | Admitting: Medical
